# Patient Record
Sex: Male | Born: 1945 | Race: White | Hispanic: No | Marital: Single | Smoking: Former smoker
Health system: Southern US, Community
[De-identification: ages and names within clinical notes are randomized; demographics above are authoritative.]

## PROBLEM LIST (undated history)

## (undated) DIAGNOSIS — K219 Gastro-esophageal reflux disease without esophagitis: Secondary | ICD-10-CM

## (undated) DIAGNOSIS — I350 Nonrheumatic aortic (valve) stenosis: Secondary | ICD-10-CM

## (undated) DIAGNOSIS — N400 Enlarged prostate without lower urinary tract symptoms: Secondary | ICD-10-CM

## (undated) DIAGNOSIS — I10 Essential (primary) hypertension: Secondary | ICD-10-CM

## (undated) DIAGNOSIS — Z87442 Personal history of urinary calculi: Secondary | ICD-10-CM

## (undated) DIAGNOSIS — I480 Paroxysmal atrial fibrillation: Secondary | ICD-10-CM

## (undated) DIAGNOSIS — I2510 Atherosclerotic heart disease of native coronary artery without angina pectoris: Secondary | ICD-10-CM

## (undated) DIAGNOSIS — E119 Type 2 diabetes mellitus without complications: Secondary | ICD-10-CM

## (undated) DIAGNOSIS — E7800 Pure hypercholesterolemia, unspecified: Secondary | ICD-10-CM

## (undated) DIAGNOSIS — R011 Cardiac murmur, unspecified: Secondary | ICD-10-CM

## (undated) DIAGNOSIS — I499 Cardiac arrhythmia, unspecified: Secondary | ICD-10-CM

## (undated) DIAGNOSIS — F419 Anxiety disorder, unspecified: Secondary | ICD-10-CM

## (undated) HISTORY — PX: BACK SURGERY: SHX140

## (undated) HISTORY — PX: TONSILLECTOMY: SUR1361

## (undated) HISTORY — PX: CERVICAL SPINE SURGERY: SHX589

## (undated) SURGERY — EGD (ESOPHAGOGASTRODUODENOSCOPY)
Anesthesia: Moderate Sedation

## (undated) SURGERY — COLONOSCOPY
Anesthesia: Moderate Sedation

---

## 2010-09-27 DIAGNOSIS — G8929 Other chronic pain: Secondary | ICD-10-CM | POA: Insufficient documentation

## 2010-09-27 DIAGNOSIS — Z87438 Personal history of other diseases of male genital organs: Secondary | ICD-10-CM | POA: Insufficient documentation

## 2011-11-21 DIAGNOSIS — N200 Calculus of kidney: Secondary | ICD-10-CM | POA: Insufficient documentation

## 2012-03-10 DIAGNOSIS — R918 Other nonspecific abnormal finding of lung field: Secondary | ICD-10-CM | POA: Insufficient documentation

## 2012-05-08 DIAGNOSIS — Z794 Long term (current) use of insulin: Secondary | ICD-10-CM | POA: Insufficient documentation

## 2012-09-08 DIAGNOSIS — M1712 Unilateral primary osteoarthritis, left knee: Secondary | ICD-10-CM | POA: Insufficient documentation

## 2014-09-23 DIAGNOSIS — Z79891 Long term (current) use of opiate analgesic: Secondary | ICD-10-CM | POA: Insufficient documentation

## 2016-04-03 DIAGNOSIS — K5732 Diverticulitis of large intestine without perforation or abscess without bleeding: Secondary | ICD-10-CM | POA: Insufficient documentation

## 2018-07-15 ENCOUNTER — Ambulatory Visit (INDEPENDENT_AMBULATORY_CARE_PROVIDER_SITE_OTHER): Payer: Medicare PPO | Admitting: Urology

## 2018-07-15 DIAGNOSIS — R351 Nocturia: Secondary | ICD-10-CM

## 2018-07-23 ENCOUNTER — Other Ambulatory Visit (HOSPITAL_BASED_OUTPATIENT_CLINIC_OR_DEPARTMENT_OTHER): Payer: Self-pay

## 2018-07-23 DIAGNOSIS — G4733 Obstructive sleep apnea (adult) (pediatric): Secondary | ICD-10-CM

## 2018-08-05 ENCOUNTER — Other Ambulatory Visit (HOSPITAL_BASED_OUTPATIENT_CLINIC_OR_DEPARTMENT_OTHER): Payer: Self-pay

## 2018-08-10 ENCOUNTER — Other Ambulatory Visit (HOSPITAL_COMMUNITY)
Admission: RE | Admit: 2018-08-10 | Discharge: 2018-08-10 | Disposition: A | Discharge status: Home / Self Care | Payer: Medicare PPO | Source: Ambulatory Visit | Attending: Neurology | Admitting: Neurology

## 2018-08-10 DIAGNOSIS — Z1159 Encounter for screening for other viral diseases: Secondary | ICD-10-CM | POA: Insufficient documentation

## 2018-08-10 DIAGNOSIS — Z01812 Encounter for preprocedural laboratory examination: Secondary | ICD-10-CM | POA: Insufficient documentation

## 2018-08-11 LAB — NOVEL CORONAVIRUS, NAA (HOSP ORDER, SEND-OUT TO REF LAB; TAT 18-24 HRS): SARS-CoV-2, NAA: NOT DETECTED

## 2018-08-13 ENCOUNTER — Other Ambulatory Visit: Payer: Self-pay

## 2018-08-13 ENCOUNTER — Ambulatory Visit: Payer: Medicare PPO | Attending: Neurology | Admitting: Neurology

## 2018-08-13 DIAGNOSIS — G4761 Periodic limb movement disorder: Secondary | ICD-10-CM | POA: Diagnosis not present

## 2018-08-13 DIAGNOSIS — G4733 Obstructive sleep apnea (adult) (pediatric): Secondary | ICD-10-CM

## 2018-08-20 NOTE — Procedures (Signed)
  [place] A. Merlene Laughter, MD     [email or web address]             NOCTURNAL POLYSOMNOGRAPHY   LOCATION: [facility]   Patient Name: Alec Larson, Alec Larson Date: 08/13/2018 Gender: Male D.O.B: 06-Oct-1945 Age (years): 72 Referring Provider: Phillips Odor MD, ABSM Height (inches): 65 Interpreting Physician: Phillips Odor MD, ABSM Weight (lbs): 215 RPSGT: Peak, Robert BMI: 36 MRN: [id number] Neck Size: 17.50 CLINICAL INFORMATION Sleep Study Type: NPSG     Indication for sleep study: N/A     Epworth Sleepiness Score: 5     SLEEP STUDY TECHNIQUE As per the AASM Manual for the Scoring of Sleep and Associated Events v2.3 (April 2016) with a hypopnea requiring 4% desaturations.  The channels recorded and monitored were frontal, central and occipital EEG, electrooculogram (EOG), submentalis EMG (chin), nasal and oral airflow, thoracic and abdominal wall motion, anterior tibialis EMG, snore microphone, electrocardiogram, and pulse oximetry.  MEDICATIONS Medications self-administered by patient taken the night of the study : N/A No current outpatient medications on file.      SLEEP ARCHITECTURE The study was initiated at 9:03:50 PM and ended at 4:31:25 AM.  Sleep onset time was 68.0 minutes and the sleep efficiency was 54.0%%. The total sleep time was 241.6 minutes.  Stage REM latency was N/A minutes.  The patient spent 22.1%% of the night in stage N1 sleep, 77.9%% in stage N2 sleep, 0.0%% in stage N3 and 0% in REM.  Alpha intrusion was absent.  Supine sleep was 27.53%.  RESPIRATORY PARAMETERS The overall apnea/hypopnea index (AHI) was 14.4 per hour. There were 0 total apneas, including 0 obstructive, 0 central and 0 mixed apneas. There were 58 hypopneas and 22 RERAs.  The AHI during Stage REM sleep was N/A per hour.  AHI while supine was 29.8 per hour.  The mean oxygen saturation was 87.8%. The minimum SpO2 during sleep was 84.0%.  soft snoring  was noted during this study.  CARDIAC DATA The 2 lead EKG demonstrated sinus rhythm. The mean heart rate was 63.4 beats per minute. Other EKG findings include: None.  LEG MOVEMENT DATA The PLM  Index is estimated to be moderate. Associated arousal with leg movement index was 3.2.  IMPRESSIONS 1. Moderate obstructive sleep apnea is documented with this study. AutoPAP 8-14 is recommended. 2. 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 3. Abnormal sleep architecture is noted with absent slow wave sleep, absent REM sleep and reduced sleep efficiency.   Delano Metz, MD Diplomate, American Board of Sleep Medicine.  ELECTRONICALLY SIGNED ON:  08/20/2018, 5:53 PM [place] PH: [phone]   FX: [phone] [place]

## 2018-09-02 ENCOUNTER — Ambulatory Visit (INDEPENDENT_AMBULATORY_CARE_PROVIDER_SITE_OTHER): Payer: Medicare PPO | Admitting: Urology

## 2018-09-02 DIAGNOSIS — N401 Enlarged prostate with lower urinary tract symptoms: Secondary | ICD-10-CM | POA: Diagnosis not present

## 2018-09-02 DIAGNOSIS — R351 Nocturia: Secondary | ICD-10-CM

## 2018-10-14 ENCOUNTER — Ambulatory Visit (INDEPENDENT_AMBULATORY_CARE_PROVIDER_SITE_OTHER): Payer: Medicare PPO | Admitting: Urology

## 2018-10-14 DIAGNOSIS — N401 Enlarged prostate with lower urinary tract symptoms: Secondary | ICD-10-CM | POA: Diagnosis not present

## 2018-10-14 DIAGNOSIS — R351 Nocturia: Secondary | ICD-10-CM

## 2018-11-04 ENCOUNTER — Ambulatory Visit (INDEPENDENT_AMBULATORY_CARE_PROVIDER_SITE_OTHER): Payer: Medicare PPO | Admitting: Urology

## 2018-11-04 DIAGNOSIS — R3914 Feeling of incomplete bladder emptying: Secondary | ICD-10-CM

## 2018-12-30 ENCOUNTER — Ambulatory Visit (INDEPENDENT_AMBULATORY_CARE_PROVIDER_SITE_OTHER): Payer: Medicare PPO | Admitting: Urology

## 2018-12-30 DIAGNOSIS — N401 Enlarged prostate with lower urinary tract symptoms: Secondary | ICD-10-CM | POA: Diagnosis not present

## 2018-12-30 DIAGNOSIS — R351 Nocturia: Secondary | ICD-10-CM

## 2019-01-04 ENCOUNTER — Other Ambulatory Visit: Payer: Self-pay | Admitting: Urology

## 2019-01-05 ENCOUNTER — Encounter (INDEPENDENT_AMBULATORY_CARE_PROVIDER_SITE_OTHER): Payer: Self-pay | Admitting: Gastroenterology

## 2019-01-13 ENCOUNTER — Encounter (HOSPITAL_COMMUNITY): Payer: Self-pay

## 2019-01-13 NOTE — Patient Instructions (Signed)
Alec Larson  01/13/2019     @PREFPERIOPPHARMACY @   Your procedure is scheduled on  01/20/2019 .  Report to [facility] at  1115  A.M.  Call this number if you have problems the morning of surgery:  [phone]   Remember:  Do not eat or drink after midnight.                         Take these medicines the morning of surgery with A SIP OF WATER  Amlodipine, cymbalta, gabapentin, metoprolol, prilosec, oxycodone(if needed). Only take 1/2 of your usual night time insulin dosage the night before your surgery. DO NOT take any medications for diabetes the morning of your surgery.    Do not wear jewelry, make-up or nail polish.  Do not wear lotions, powders, or perfumes. Please wear deodorant and brush your teeth.  Do not shave 48 hours prior to surgery.  Men may shave face and neck.  Do not bring valuables to the hospital.  [facility] is not responsible for any belongings or valuables.  Contacts, dentures or bridgework may not be worn into surgery.  Leave your suitcase in the car.  After surgery it may be brought to your room.  For patients admitted to the hospital, discharge time will be determined by your treatment team.  Patients discharged the day of surgery will not be allowed to drive home.   Name and phone number of your driver:   family Special instructions:  None  Please read over the following fact sheets that you were given. Anesthesia Post-op Instructions and Care and Recovery After Surgery       Cystoscopy Cystoscopy is a procedure that is used to help diagnose and sometimes treat conditions that affect the lower urinary tract. The lower urinary tract includes the bladder and the urethra. The urethra is the tube that drains urine from the bladder. Cystoscopy is done using a thin, tube-shaped instrument with a light and camera at the end (cystoscope). The cystoscope may be hard or flexible, depending on the goal of the procedure. The cystoscope is inserted  through the urethra, into the bladder. Cystoscopy may be recommended if you have:  Urinary tract infections that keep coming back.  Blood in the urine (hematuria).  An inability to control when you urinate (urinary incontinence) or an overactive bladder.  Unusual cells found in a urine sample.  A blockage in the urethra, such as a urinary stone.  Painful urination.  An abnormality in the bladder found during an intravenous pyelogram (IVP) or CT scan. Cystoscopy may also be done to remove a sample of tissue to be examined under a microscope (biopsy). Tell a health care provider about:  Any allergies you have.  All medicines you are taking, including vitamins, herbs, eye drops, creams, and over-the-counter medicines.  Any problems you or family members have had with anesthetic medicines.  Any blood disorders you have.  Any surgeries you have had.  Any medical conditions you have.  Whether you are pregnant or may be pregnant. What are the risks? Generally, this is a safe procedure. However, problems may occur, including:  Infection.  Bleeding.  Allergic reactions to medicines.  Damage to other structures or organs. What happens before the procedure?  Ask your health care provider about: ? Changing or stopping your regular medicines. This is especially important if you are taking diabetes medicines or blood thinners. ? Taking medicines  such as aspirin and ibuprofen. These medicines can thin your blood. Do not take these medicines unless your health care provider tells you to take them. ? Taking over-the-counter medicines, vitamins, herbs, and supplements.  Follow instructions from your health care provider about eating or drinking restrictions.  Ask your health care provider what steps will be taken to help prevent infection. These may include: ? Washing skin with a germ-killing soap. ? Taking antibiotic medicine.  You may have an exam or testing, such as: ? X-rays  of the bladder, urethra, or kidneys. ? Urine tests to check for signs of infection.  Plan to have someone take you home from the hospital or clinic. What happens during the procedure?   You will be given one or more of the following: ? A medicine to help you relax (sedative). ? A medicine to numb the area (local anesthetic).  The area around the opening of your urethra will be cleaned.  The cystoscope will be passed through your urethra into your bladder.  Germ-free (sterile) fluid will flow through the cystoscope to fill your bladder. The fluid will stretch your bladder so that your health care provider can clearly examine your bladder walls.  Your doctor will look at the urethra and bladder. Your doctor may take a biopsy or remove stones.  The cystoscope will be removed, and your bladder will be emptied. The procedure may vary among health care providers and hospitals. What can I expect after the procedure? After the procedure, it is common to have:  Some soreness or pain in your abdomen and urethra.  Urinary symptoms. These include: ? Mild pain or burning when you urinate. Pain should stop within a few minutes after you urinate. This may last for up to 1 week. ? A small amount of blood in your urine for several days. ? Feeling like you need to urinate but producing only a small amount of urine. Follow these instructions at home: Medicines  Take over-the-counter and prescription medicines only as told by your health care provider.  If you were prescribed an antibiotic medicine, take it as told by your health care provider. Do not stop taking the antibiotic even if you start to feel better. General instructions  Return to your normal activities as told by your health care provider. Ask your health care provider what activities are safe for you.  Do not drive for 24 hours if you were given a sedative during your procedure.  Watch for any blood in your urine. If the amount of  blood in your urine increases, call your health care provider.  Follow instructions from your health care provider about eating or drinking restrictions.  If a tissue sample was removed for testing (biopsy) during your procedure, it is up to you to get your test results. Ask your health care provider, or the department that is doing the test, when your results will be ready.  Drink enough fluid to keep your urine pale yellow.  Keep all follow-up visits as told by your health care provider. This is important. Contact a health care provider if you:  Have pain that gets worse or does not get better with medicine, especially pain when you urinate.  Have trouble urinating.  Have more blood in your urine. Get help right away if you:  Have blood clots in your urine.  Have abdominal pain.  Have a fever or chills.  Are unable to urinate. Summary  Cystoscopy is a procedure that is used to help  diagnose and sometimes treat conditions that affect the lower urinary tract.  Cystoscopy is done using a thin, tube-shaped instrument with a light and camera at the end.  After the procedure, it is common to have some soreness or pain in your abdomen and urethra.  Watch for any blood in your urine. If the amount of blood in your urine increases, call your health care provider.  If you were prescribed an antibiotic medicine, take it as told by your health care provider. Do not stop taking the antibiotic even if you start to feel better. This information is not intended to replace advice given to you by your health care provider. Make sure you discuss any questions you have with your health care provider. Document Released: 01/26/2000 Document Revised: 01/20/2018 Document Reviewed: 01/20/2018 Elsevier Patient Education  2020 [employer] These instructions provide you with information about caring for yourself after your procedure. Your health care provider  may also give you more specific instructions. Your treatment has been planned according to current medical practices, but problems sometimes occur. Call your health care provider if you have any problems or questions after your procedure. What can I expect after the procedure? After your procedure, you may:  Feel sleepy for several hours.  Feel clumsy and have poor balance for several hours.  Feel forgetful about what happened after the procedure.  Have poor judgment for several hours.  Feel nauseous or vomit.  Have a sore throat if you had a breathing tube during the procedure. Follow these instructions at home: For at least 24 hours after the procedure:      Have a responsible adult stay with you. It is important to have someone help care for you until you are awake and alert.  Rest as needed.  Do not: ? Participate in activities in which you could fall or become injured. ? Drive. ? Use heavy machinery. ? Drink alcohol. ? Take sleeping pills or medicines that cause drowsiness. ? Make important decisions or sign legal documents. ? Take care of children on your own. Eating and drinking  Follow the diet that is recommended by your health care provider.  If you vomit, drink water, juice, or soup when you can drink without vomiting.  Make sure you have little or no nausea before eating solid foods. General instructions  Take over-the-counter and prescription medicines only as told by your health care provider.  If you have sleep apnea, surgery and certain medicines can increase your risk for breathing problems. Follow instructions from your health care provider about wearing your sleep device: ? Anytime you are sleeping, including during daytime naps. ? While taking prescription pain medicines, sleeping medicines, or medicines that make you drowsy.  If you smoke, do not smoke without supervision.  Keep all follow-up visits as told by your health care provider. This is  important. Contact a health care provider if:  You keep feeling nauseous or you keep vomiting.  You feel light-headed.  You develop a rash.  You have a fever. Get help right away if:  You have trouble breathing. Summary  For several hours after your procedure, you may feel sleepy and have poor judgment.  Have a responsible adult stay with you for at least 24 hours or until you are awake and alert. This information is not intended to replace advice given to you by your health care provider. Make sure you discuss any questions you have with your health care provider.  Document Released: 05/21/2015 Document Revised: 04/28/2017 Document Reviewed: 05/21/2015 Elsevier Patient Education  2020 [employer]. How to Use Chlorhexidine for Bathing Chlorhexidine gluconate (CHG) is a germ-killing (antiseptic) solution that is used to clean the skin. It can get rid of the bacteria that normally live on the skin and can keep them away for about 24 hours. To clean your skin with CHG, you may be given:  A CHG solution to use in the shower or as part of a sponge bath.  A prepackaged cloth that contains CHG. Cleaning your skin with CHG may help lower the risk for infection:  While you are staying in the intensive care unit of the hospital.  If you have a vascular access, such as a central line, to provide short-term or long-term access to your veins.  If you have a catheter to drain urine from your bladder.  If you are on a ventilator. A ventilator is a machine that helps you breathe by moving air in and out of your lungs.  After surgery. What are the risks? Risks of using CHG include:  A skin reaction.  Hearing loss, if CHG gets in your ears.  Eye injury, if CHG gets in your eyes and is not rinsed out.  The CHG product catching fire. Make sure that you avoid smoking and flames after applying CHG to your skin. Do not use CHG:  If you have a chlorhexidine allergy or have previously  reacted to chlorhexidine.  On babies younger than 51 months of age. How to use CHG solution  Use CHG only as told by your health care provider, and follow the instructions on the label.  Use the full amount of CHG as directed. Usually, this is one bottle. During a shower Follow these steps when using CHG solution during a shower (unless your health care provider gives you different instructions): 1. Start the shower. 2. Use your normal soap and shampoo to wash your face and hair. 3. Turn off the shower or move out of the shower stream. 4. Pour the CHG onto a clean washcloth. Do not use any type of brush or rough-edged sponge. 5. Starting at your neck, lather your body down to your toes. Make sure you follow these instructions: ? If you will be having surgery, pay special attention to the part of your body where you will be having surgery. Scrub this area for at least 1 minute. ? Do not use CHG on your head or face. If the solution gets into your ears or eyes, rinse them well with water. ? Avoid your genital area. ? Avoid any areas of skin that have broken skin, cuts, or scrapes. ? Scrub your back and under your arms. Make sure to wash skin folds. 6. Let the lather sit on your skin for 1-2 minutes or as long as told by your health care provider. 7. Thoroughly rinse your entire body in the shower. Make sure that all body creases and crevices are rinsed well. 8. Dry off with a clean towel. Do not put any substances on your body afterward-such as powder, lotion, or perfume-unless you are told to do so by your health care provider. Only use lotions that are recommended by the manufacturer. 9. Put on clean clothes or pajamas. 10. If it is the night before your surgery, sleep in clean sheets.  During a sponge bath Follow these steps when using CHG solution during a sponge bath (unless your health care provider gives you different instructions): 1. Use  your normal soap and shampoo to wash your  face and hair. 2. Pour the CHG onto a clean washcloth. 3. Starting at your neck, lather your body down to your toes. Make sure you follow these instructions: ? If you will be having surgery, pay special attention to the part of your body where you will be having surgery. Scrub this area for at least 1 minute. ? Do not use CHG on your head or face. If the solution gets into your ears or eyes, rinse them well with water. ? Avoid your genital area. ? Avoid any areas of skin that have broken skin, cuts, or scrapes. ? Scrub your back and under your arms. Make sure to wash skin folds. 4. Let the lather sit on your skin for 1-2 minutes or as long as told by your health care provider. 5. Using a different clean, wet washcloth, thoroughly rinse your entire body. Make sure that all body creases and crevices are rinsed well. 6. Dry off with a clean towel. Do not put any substances on your body afterward-such as powder, lotion, or perfume-unless you are told to do so by your health care provider. Only use lotions that are recommended by the manufacturer. 7. Put on clean clothes or pajamas. 8. If it is the night before your surgery, sleep in clean sheets. How to use CHG prepackaged cloths  Only use CHG cloths as told by your health care provider, and follow the instructions on the label.  Use the CHG cloth on clean, dry skin.  Do not use the CHG cloth on your head or face unless your health care provider tells you to.  When washing with the CHG cloth: ? Avoid your genital area. ? Avoid any areas of skin that have broken skin, cuts, or scrapes. Before surgery Follow these steps when using a CHG cloth to clean before surgery (unless your health care provider gives you different instructions): 1. Using the CHG cloth, vigorously scrub the part of your body where you will be having surgery. Scrub using a back-and-forth motion for 3 minutes. The area on your body should be completely wet with CHG when you are  done scrubbing. 2. Do not rinse. Discard the cloth and let the area air-dry. Do not put any substances on the area afterward, such as powder, lotion, or perfume. 3. Put on clean clothes or pajamas. 4. If it is the night before your surgery, sleep in clean sheets.  For general bathing Follow these steps when using CHG cloths for general bathing (unless your health care provider gives you different instructions). 1. Use a separate CHG cloth for each area of your body. Make sure you wash between any folds of skin and between your fingers and toes. Wash your body in the following order, switching to a new cloth after each step: ? The front of your neck, shoulders, and chest. ? Both of your arms, under your arms, and your hands. ? Your stomach and groin area, avoiding the genitals. ? Your right leg and foot. ? Your left leg and foot. ? The back of your neck, your back, and your buttocks. 2. Do not rinse. Discard the cloth and let the area air-dry. Do not put any substances on your body afterward-such as powder, lotion, or perfume-unless you are told to do so by your health care provider. Only use lotions that are recommended by the manufacturer. 3. Put on clean clothes or pajamas. Contact a health care provider if:  Your skin  gets irritated after scrubbing.  You have questions about using your solution or cloth. Get help right away if:  Your eyes become very red or swollen.  Your eyes itch badly.  Your skin itches badly and is red or swollen.  Your hearing changes.  You have trouble seeing.  You have swelling or tingling in your mouth or throat.  You have trouble breathing.  You swallow any chlorhexidine. Summary  Chlorhexidine gluconate (CHG) is a germ-killing (antiseptic) solution that is used to clean the skin. Cleaning your skin with CHG may help to lower your risk for infection.  You may be given CHG to use for bathing. It may be in a bottle or in a prepackaged cloth to use  on your skin. Carefully follow your health care provider's instructions and the instructions on the product label.  Do not use CHG if you have a chlorhexidine allergy.  Contact your health care provider if your skin gets irritated after scrubbing. This information is not intended to replace advice given to you by your health care provider. Make sure you discuss any questions you have with your health care provider. Document Released: 10/23/2011 Document Revised: 04/16/2018 Document Reviewed: 12/26/2016 Elsevier Patient Education  2020 [employer].

## 2019-01-15 ENCOUNTER — Encounter (HOSPITAL_COMMUNITY): Payer: Self-pay

## 2019-01-15 ENCOUNTER — Other Ambulatory Visit: Payer: Self-pay

## 2019-01-15 ENCOUNTER — Encounter (HOSPITAL_COMMUNITY)
Admission: RE | Admit: 2019-01-15 | Discharge: 2019-01-15 | Disposition: A | Discharge status: Home / Self Care | Payer: Medicare PPO | Source: Ambulatory Visit | Attending: Urology | Admitting: Urology

## 2019-01-15 DIAGNOSIS — Z01818 Encounter for other preprocedural examination: Secondary | ICD-10-CM | POA: Diagnosis not present

## 2019-01-15 HISTORY — DX: Pure hypercholesterolemia, unspecified: E78.00

## 2019-01-15 HISTORY — DX: Gastro-esophageal reflux disease without esophagitis: K21.9

## 2019-01-15 HISTORY — DX: Benign prostatic hyperplasia without lower urinary tract symptoms: N40.0

## 2019-01-15 HISTORY — DX: Personal history of urinary calculi: Z87.442

## 2019-01-15 HISTORY — DX: Essential (primary) hypertension: I10

## 2019-01-15 HISTORY — DX: Type 2 diabetes mellitus without complications: E11.9

## 2019-01-15 HISTORY — DX: Anxiety disorder, unspecified: F41.9

## 2019-01-15 LAB — BASIC METABOLIC PANEL
Anion gap: 13 (ref 5–15)
BUN: 16 mg/dL (ref 8–23)
CO2: 24 mmol/L (ref 22–32)
Calcium: 9 mg/dL (ref 8.9–10.3)
Chloride: 99 mmol/L (ref 98–111)
Creatinine, Ser: 1.25 mg/dL — ABNORMAL HIGH (ref 0.61–1.24)
GFR calc Af Amer: >60 mL/min (ref >60)
GFR calc non Af Amer: 56 mL/min — ABNORMAL LOW (ref >60)
Glucose, Bld: 217 mg/dL — ABNORMAL HIGH (ref 70–99)
Potassium: 3.6 mmol/L (ref 3.5–5.1)
Sodium: 136 mmol/L (ref 135–145)

## 2019-01-15 LAB — CBC WITH DIFFERENTIAL/PLATELET
Abs Immature Granulocytes: 0.16 10*3/uL — ABNORMAL HIGH (ref 0.00–0.07)
Basophils Absolute: 0.1 10*3/uL (ref 0.0–0.1)
Basophils Relative: 1 %
Eosinophils Absolute: 0.5 10*3/uL (ref 0.0–0.5)
Eosinophils Relative: 4 %
HCT: 42.5 % (ref 39.0–52.0)
Hemoglobin: 13.7 g/dL (ref 13.0–17.0)
Immature Granulocytes: 1 %
Lymphocytes Relative: 22 %
Lymphs Abs: 2.4 10*3/uL (ref 0.7–4.0)
MCH: 28.8 pg (ref 26.0–34.0)
MCHC: 32.2 g/dL (ref 30.0–36.0)
MCV: 89.3 fL (ref 80.0–100.0)
Monocytes Absolute: 1.1 10*3/uL — ABNORMAL HIGH (ref 0.1–1.0)
Monocytes Relative: 10 %
Neutro Abs: 6.8 10*3/uL (ref 1.7–7.7)
Neutrophils Relative %: 62 %
Platelets: 295 10*3/uL (ref 150–400)
RBC: 4.76 MIL/uL (ref 4.22–5.81)
RDW: 14.5 % (ref 11.5–15.5)
WBC: 11 10*3/uL — ABNORMAL HIGH (ref 4.0–10.5)
nRBC: 0 % (ref 0.0–0.2)

## 2019-01-15 LAB — HEMOGLOBIN A1C
Hgb A1c MFr Bld: 8.8 % — ABNORMAL HIGH (ref 4.8–5.6)
Mean Plasma Glucose: 205.86 mg/dL

## 2019-01-15 LAB — GLUCOSE, CAPILLARY: Glucose-Capillary: 188 mg/dL — ABNORMAL HIGH (ref 70–99)

## 2019-01-18 NOTE — Pre-Procedure Instructions (Signed)
HgbA1C routed to surgeon.

## 2019-01-19 ENCOUNTER — Other Ambulatory Visit: Payer: Self-pay

## 2019-01-19 ENCOUNTER — Other Ambulatory Visit (HOSPITAL_COMMUNITY)
Admission: RE | Admit: 2019-01-19 | Discharge: 2019-01-19 | Disposition: A | Discharge status: Home / Self Care | Payer: Medicare PPO | Source: Ambulatory Visit | Attending: Urology | Admitting: Urology

## 2019-01-19 DIAGNOSIS — Z20828 Contact with and (suspected) exposure to other viral communicable diseases: Secondary | ICD-10-CM | POA: Insufficient documentation

## 2019-01-19 DIAGNOSIS — Z01812 Encounter for preprocedural laboratory examination: Secondary | ICD-10-CM | POA: Insufficient documentation

## 2019-01-19 LAB — SARS CORONAVIRUS 2 (TAT 6-24 HRS): SARS Coronavirus 2: NEGATIVE

## 2019-01-20 ENCOUNTER — Encounter (HOSPITAL_COMMUNITY)
Admission: RE | Disposition: A | Discharge status: Home / Self Care | Payer: Self-pay | Source: Home / Self Care | Attending: Urology

## 2019-01-20 ENCOUNTER — Ambulatory Visit (HOSPITAL_COMMUNITY): Payer: Medicare PPO | Admitting: Anesthesiology

## 2019-01-20 ENCOUNTER — Ambulatory Visit (HOSPITAL_COMMUNITY)
Admission: RE | Admit: 2019-01-20 | Discharge: 2019-01-20 | Disposition: A | Discharge status: Home / Self Care | Payer: Medicare PPO | Attending: Urology | Admitting: Urology

## 2019-01-20 ENCOUNTER — Encounter (HOSPITAL_COMMUNITY): Payer: Self-pay | Admitting: *Deleted

## 2019-01-20 DIAGNOSIS — N138 Other obstructive and reflux uropathy: Secondary | ICD-10-CM | POA: Diagnosis not present

## 2019-01-20 DIAGNOSIS — R350 Frequency of micturition: Secondary | ICD-10-CM | POA: Diagnosis not present

## 2019-01-20 DIAGNOSIS — Z87891 Personal history of nicotine dependence: Secondary | ICD-10-CM | POA: Insufficient documentation

## 2019-01-20 DIAGNOSIS — R3915 Urgency of urination: Secondary | ICD-10-CM | POA: Insufficient documentation

## 2019-01-20 DIAGNOSIS — N320 Bladder-neck obstruction: Secondary | ICD-10-CM | POA: Diagnosis not present

## 2019-01-20 DIAGNOSIS — I10 Essential (primary) hypertension: Secondary | ICD-10-CM | POA: Insufficient documentation

## 2019-01-20 DIAGNOSIS — E119 Type 2 diabetes mellitus without complications: Secondary | ICD-10-CM | POA: Diagnosis not present

## 2019-01-20 DIAGNOSIS — Z880 Allergy status to penicillin: Secondary | ICD-10-CM | POA: Insufficient documentation

## 2019-01-20 DIAGNOSIS — Z886 Allergy status to analgesic agent status: Secondary | ICD-10-CM | POA: Insufficient documentation

## 2019-01-20 DIAGNOSIS — N401 Enlarged prostate with lower urinary tract symptoms: Secondary | ICD-10-CM | POA: Insufficient documentation

## 2019-01-20 DIAGNOSIS — Z87442 Personal history of urinary calculi: Secondary | ICD-10-CM | POA: Diagnosis not present

## 2019-01-20 HISTORY — PX: CYSTOSCOPY WITH INSERTION OF UROLIFT: SHX6678

## 2019-01-20 LAB — GLUCOSE, CAPILLARY: Glucose-Capillary: 201 mg/dL — ABNORMAL HIGH (ref 70–99)

## 2019-01-20 SURGERY — CYSTOSCOPY WITH INSERTION OF UROLIFT
Anesthesia: General

## 2019-01-20 MED ORDER — LIDOCAINE 2% (20 MG/ML) 5 ML SYRINGE
INTRAMUSCULAR | Status: DC | PRN
  Administered 2019-01-20: 40 mg via INTRAVENOUS

## 2019-01-20 MED ORDER — WATER FOR IRRIGATION, STERILE IR SOLN
Status: DC | PRN
  Administered 2019-01-20: 3000 mL

## 2019-01-20 MED ORDER — FENTANYL CITRATE (PF) 100 MCG/2ML IJ SOLN
INTRAMUSCULAR | Status: DC | PRN
  Administered 2019-01-20 (×2): 50 ug via INTRAVENOUS

## 2019-01-20 MED ORDER — PROPOFOL 10 MG/ML IV BOLUS
INTRAVENOUS | Status: AC
  Filled 2019-01-20: qty 20

## 2019-01-20 MED ORDER — GLYCOPYRROLATE PF 0.2 MG/ML IJ SOSY
PREFILLED_SYRINGE | INTRAMUSCULAR | Status: DC | PRN
  Administered 2019-01-20: .2 mg via INTRAVENOUS

## 2019-01-20 MED ORDER — HYDROMORPHONE HCL 1 MG/ML IJ SOLN: 0.2500 mg | INTRAMUSCULAR | Status: DC | PRN

## 2019-01-20 MED ORDER — MEPERIDINE HCL 50 MG/ML IJ SOLN: 6.2500 mg | INTRAMUSCULAR | Status: DC | PRN

## 2019-01-20 MED ORDER — LIDOCAINE VISCOUS HCL 2 % MT SOLN
OROMUCOSAL | Status: AC
  Filled 2019-01-20: qty 15

## 2019-01-20 MED ORDER — FENTANYL CITRATE (PF) 100 MCG/2ML IJ SOLN
INTRAMUSCULAR | Status: AC
  Filled 2019-01-20: qty 2

## 2019-01-20 MED ORDER — CEFAZOLIN SODIUM-DEXTROSE 2-4 GM/100ML-% IV SOLN
2.0000 g | INTRAVENOUS | Status: AC
  Administered 2019-01-20: 2 g via INTRAVENOUS

## 2019-01-20 MED ORDER — LIDOCAINE HCL URETHRAL/MUCOSAL 2 % EX GEL
CUTANEOUS | Status: AC
  Filled 2019-01-20: qty 10

## 2019-01-20 MED ORDER — OXYCODONE-ACETAMINOPHEN 10-325 MG PO TABS: 1.0000 | ORAL_TABLET | Freq: Four times a day (QID) | ORAL | 0 refills | Status: DC | PRN

## 2019-01-20 MED ORDER — ONDANSETRON HCL 4 MG/2ML IJ SOLN: 4.0000 mg | Freq: Once | INTRAMUSCULAR | Status: DC | PRN

## 2019-01-20 MED ORDER — CEFAZOLIN SODIUM-DEXTROSE 2-4 GM/100ML-% IV SOLN
INTRAVENOUS | Status: AC
  Filled 2019-01-20: qty 100

## 2019-01-20 MED ORDER — PROPOFOL 10 MG/ML IV BOLUS
INTRAVENOUS | Status: DC | PRN
  Administered 2019-01-20: 240 mg via INTRAVENOUS

## 2019-01-20 MED ORDER — LACTATED RINGERS IV SOLN
INTRAVENOUS | Status: DC | PRN
  Administered 2019-01-20: 12:00:00 via INTRAVENOUS

## 2019-01-20 MED ORDER — LACTATED RINGERS IV SOLN
Freq: Once | INTRAVENOUS | Status: AC
  Administered 2019-01-20: 11:00:00 via INTRAVENOUS

## 2019-01-20 SURGICAL SUPPLY — 20 items
BAG DRAIN CYSTO-URO STER (DRAIN) ×3 IMPLANT
BAG DRAIN URO TABLE W/ADPT NS (BAG) ×3 IMPLANT
CATH FOLEY 2WAY SLVR  5CC 16FR (CATHETERS) ×2
CATH FOLEY 2WAY SLVR 5CC 16FR (CATHETERS) ×1 IMPLANT
CLOTH BEACON ORANGE TIMEOUT ST (SAFETY) ×3 IMPLANT
GLOVE BIO SURGEON STRL SZ8 (GLOVE) ×3 IMPLANT
GLOVE BIOGEL PI IND STRL 7.0 (GLOVE) ×2 IMPLANT
GLOVE BIOGEL PI INDICATOR 7.0 (GLOVE) ×4
GOWN STRL REUS W/TWL LRG LVL3 (GOWN DISPOSABLE) ×3 IMPLANT
GOWN STRL REUS W/TWL XL LVL3 (GOWN DISPOSABLE) ×3 IMPLANT
KIT TURNOVER CYSTO (KITS) ×3 IMPLANT
MANIFOLD NEPTUNE II (INSTRUMENTS) ×3 IMPLANT
PACK CYSTO (CUSTOM PROCEDURE TRAY) ×3 IMPLANT
PAD ARMBOARD 7.5X6 YLW CONV (MISCELLANEOUS) ×3 IMPLANT
SYSTEM UROLIFT (Male Continence) ×18 IMPLANT
TOWEL OR 17X26 4PK STRL BLUE (TOWEL DISPOSABLE) ×3 IMPLANT
TRAY FOLEY W/BAG SLVR 16FR (SET/KITS/TRAYS/PACK) ×2
TRAY FOLEY W/BAG SLVR 16FR ST (SET/KITS/TRAYS/PACK) ×1 IMPLANT
WATER STERILE IRR 3000ML UROMA (IV SOLUTION) ×3 IMPLANT
WATER STERILE IRR 500ML POUR (IV SOLUTION) ×3 IMPLANT

## 2019-01-20 NOTE — Anesthesia Procedure Notes (Signed)
Procedure Name: LMA Insertion Date/Time: 01/20/2019 12:38 PM Performed by: Floreine Kingdon A, CRNA Pre-anesthesia Checklist: Patient identified, Emergency Drugs available, Suction available, Patient being monitored and Timeout performed Patient Re-evaluated:Patient Re-evaluated prior to induction Oxygen Delivery Method: Circle system utilized Preoxygenation: Pre-oxygenation with 100% oxygen Induction Type: IV induction LMA: LMA inserted LMA Size: 4.0 Number of attempts: 1 Placement Confirmation: positive ETCO2 and breath sounds checked- equal and bilateral Tube secured with: Tape

## 2019-01-20 NOTE — Anesthesia Postprocedure Evaluation (Signed)
Anesthesia Post Note  Patient: Alec Larson  Procedure(s) Performed: CYSTOSCOPY WITH INSERTION OF UROLIFT (N/A )  Patient location during evaluation: PACU Anesthesia Type: General Level of consciousness: awake, oriented, awake and alert and patient cooperative Pain management: pain level controlled Vital Signs Assessment: post-procedure vital signs reviewed and stable Respiratory status: spontaneous breathing, respiratory function stable and nonlabored ventilation Cardiovascular status: stable Postop Assessment: no apparent nausea or vomiting Anesthetic complications: no     Last Vitals:  Vitals:   01/20/19 1120  BP: (!) 186/76  Pulse: 75  Resp: 18  Temp: 36.9 C  SpO2: 97%    Last Pain:  Vitals:   01/20/19 1120  TempSrc: Oral  PainSc: 0-No pain                 Adahlia Stembridge

## 2019-01-20 NOTE — Transfer of Care (Signed)
Immediate Anesthesia Transfer of Care Note  Patient: Alec Larson  Procedure(s) Performed: CYSTOSCOPY WITH INSERTION OF UROLIFT (N/A )  Patient Location: PACU  Anesthesia Type:General  Level of Consciousness: awake, alert , oriented and patient cooperative  Airway & Oxygen Therapy: Patient Spontanous Breathing  Post-op Assessment: Report given to RN, Post -op Vital signs reviewed and stable and Patient moving all extremities X 4  Post vital signs: Reviewed and stable  Last Vitals:  Vitals Value Taken Time  BP    Temp    Pulse    Resp    SpO2      Last Pain:  Vitals:   01/20/19 1120  TempSrc: Oral  PainSc: 0-No pain      Patients Stated Pain Goal: 6 ([id number])  Complications: No apparent anesthesia complications

## 2019-01-20 NOTE — Discharge Instructions (Signed)
Indwelling Urinary Catheter Care, Adult °An indwelling urinary catheter is a thin tube that is put into your bladder. The tube helps to drain pee (urine) out of your body. The tube goes in through your urethra. Your urethra is where pee comes out of your body. Your pee will come out through the catheter, then it will go into a bag (drainage bag). °Take good care of your catheter so it will work well. °How to wear your catheter and bag °Supplies needed °· Sticky tape (adhesive tape) or a leg strap. °· Alcohol wipe or soap and water (if you use tape). °· A clean towel (if you use tape). °· Large overnight bag. °· Smaller bag (leg bag). °Wearing your catheter °Attach your catheter to your leg with tape or a leg strap. °· Make sure the catheter is not pulled tight. °· If a leg strap gets wet, take it off and put on a dry strap. °· If you use tape to hold the bag on your leg: °1. Use an alcohol wipe or soap and water to wash your skin where the tape made it sticky before. °2. Use a clean towel to pat-dry that skin. °3. Use new tape to make the bag stay on your leg. °Wearing your bags °You should have been given a large overnight bag. °· You may wear the overnight bag in the day or night. °· Always have the overnight bag lower than your bladder.  Do not let the bag touch the floor. °· Before you go to sleep, put a clean plastic bag in a wastebasket. Then hang the overnight bag inside the wastebasket. °You should also have a smaller leg bag that fits under your clothes. °· Always wear the leg bag below your knee. °· Do not wear your leg bag at night. °How to care for your skin and catheter °Supplies needed °· A clean washcloth. °· Water and mild soap. °· A clean towel. °Caring for your skin and catheter ° °  ° °· Clean the skin around your catheter every day: °? Wash your hands with soap and water. °? Wet a clean washcloth in warm water and mild soap. °? Clean the skin around your urethra. °? If you are male: °? Gently  spread the folds of skin around your vagina (labia). °? With the washcloth in your other hand, wipe the inner side of your labia on each side. Wipe from front to back. °? If you are male: °? Pull back any skin that covers the end of your penis (foreskin). °? With the washcloth in your other hand, wipe your penis in small circles. Start wiping at the tip of your penis, then move away from the catheter. °? Move the foreskin back in place, if needed. °? With your free hand, hold the catheter close to where it goes into your body. °? Keep holding the catheter during cleaning so it does not get pulled out. °? With the washcloth in your other hand, clean the catheter. °? Only wipe downward on the catheter. °? Do not wipe upward toward your body. Doing this may push germs into your urethra and cause infection. °? Use a clean towel to pat-dry the catheter and the skin around it. Make sure to wipe off all soap. °? Wash your hands with soap and water. °· Shower every day. Do not take baths. °· Do not use cream, ointment, or lotion on the area where the catheter goes into your body, unless your doctor tells you   to. °· Do not use powders, sprays, or lotions on your genital area. °· Check your skin around the catheter every day for signs of infection. Check for: °? Redness, swelling, or pain. °? Fluid or blood. °? Warmth. °? Pus or a bad smell. °How to empty the bag °Supplies needed °· Rubbing alcohol. °· Gauze pad or cotton ball. °· Tape or a leg strap. °Emptying the bag °Pour the pee out of your bag when it is ?-½ full, or at least 2-3 times a day. Do this for your overnight bag and your leg bag. °1. Wash your hands with soap and water. °2. Separate (detach) the bag from your leg. °3. Hold the bag over the toilet or a clean pail. Keep the bag lower than your hips and bladder. This is so the pee (urine) does not go back into the tube. °4. Open the pour spout. It is at the bottom of the bag. °5. Empty the pee into the toilet or  pail. Do not let the pour spout touch any surface. °6. Put rubbing alcohol on a gauze pad or cotton ball. °7. Use the gauze pad or cotton ball to clean the pour spout. °8. Close the pour spout. °9. Attach the bag to your leg with tape or a leg strap. °10. Wash your hands with soap and water. °Follow instructions for cleaning the drainage bag: °· From the product maker. °· As told by your doctor. °How to change the bag °Supplies needed °· Alcohol wipes. °· A clean bag. °· Tape or a leg strap. °Changing the bag °Replace your bag when it starts to leak, smell bad, or look dirty. °1. Wash your hands with soap and water. °2. Separate the dirty bag from your leg. °3. Pinch the catheter with your fingers so that pee does not spill out. °4. Separate the catheter tube from the bag tube where these tubes connect (at the connection valve). Do not let the tubes touch any surface. °5. Clean the end of the catheter tube with an alcohol wipe. Use a different alcohol wipe to clean the end of the bag tube. °6. Connect the catheter tube to the tube of the clean bag. °7. Attach the clean bag to your leg with tape or a leg strap. Do not make the bag tight on your leg. °8. Wash your hands with soap and water. °General rules ° °· Never pull on your catheter. Never try to take it out. Doing that can hurt you. °· Always wash your hands before and after you touch your catheter or bag. Use a mild, fragrance-free soap. If you do not have soap and water, use hand sanitizer. °· Always make sure there are no twists or bends (kinks) in the catheter tube. °· Always make sure there are no leaks in the catheter or bag. °· Drink enough fluid to keep your pee pale yellow. °· Do not take baths, swim, or use a hot tub. °· If you are male, wipe from front to back after you poop (have a bowel movement). °Contact a doctor if: °· Your pee is cloudy. °· Your pee smells worse than usual. °· Your catheter gets clogged. °· Your catheter leaks. °· Your bladder  feels full. °Get help right away if: °· You have redness, swelling, or pain where the catheter goes into your body. °· You have fluid, blood, pus, or a bad smell coming from the area where the catheter goes into your body. °· Your skin feels warm where   the catheter goes into your body.  You have a fever.  You have pain in your: ? Belly (abdomen). ? Legs. ? Lower back. ? Bladder.  You see blood in the catheter.  Your pee is pink or red.  You feel sick to your stomach (nauseous).  You throw up (vomit).  You have chills.  Your pee is not draining into the bag.  Your catheter gets pulled out. Summary  An indwelling urinary catheter is a thin tube that is placed into the bladder to help drain pee (urine) out of the body.  The catheter is placed into the part of the body that drains pee from the bladder (urethra).  Taking good care of your catheter will keep it working properly and help prevent problems.  Always wash your hands before and after touching your catheter or bag.  Never pull on your catheter or try to take it out. This information is not intended to replace advice given to you by your health care provider. Make sure you discuss any questions you have with your health care provider. Document Released: 05/25/2012 Document Revised: 05/22/2018 Document Reviewed: 09/13/2016 Elsevier Patient Education  2020 [employer].   Cystoscopy Cystoscopy is a procedure that is used to help diagnose and sometimes treat conditions that affect the lower urinary tract. The lower urinary tract includes the bladder and the urethra. The urethra is the tube that drains urine from the bladder. Cystoscopy is done using a thin, tube-shaped instrument with a light and camera at the end (cystoscope). The cystoscope may be hard or flexible, depending on the goal of the procedure. The cystoscope is inserted through the urethra, into the bladder. Cystoscopy may be recommended if you have:  Urinary  tract infections that keep coming back.  Blood in the urine (hematuria).  An inability to control when you urinate (urinary incontinence) or an overactive bladder.  Unusual cells found in a urine sample.  A blockage in the urethra, such as a urinary stone.  Painful urination.  An abnormality in the bladder found during an intravenous pyelogram (IVP) or CT scan. Cystoscopy may also be done to remove a sample of tissue to be examined under a microscope (biopsy). Tell a health care provider about:  Any allergies you have.  All medicines you are taking, including vitamins, herbs, eye drops, creams, and over-the-counter medicines.  Any problems you or family members have had with anesthetic medicines.  Any blood disorders you have.  Any surgeries you have had.  Any medical conditions you have.  Whether you are pregnant or may be pregnant. What are the risks? Generally, this is a safe procedure. However, problems may occur, including:  Infection.  Bleeding.  Allergic reactions to medicines.  Damage to other structures or organs. What happens before the procedure?  Ask your health care provider about: ? Changing or stopping your regular medicines. This is especially important if you are taking diabetes medicines or blood thinners. ? Taking medicines such as aspirin and ibuprofen. These medicines can thin your blood. Do not take these medicines unless your health care provider tells you to take them. ? Taking over-the-counter medicines, vitamins, herbs, and supplements.  Follow instructions from your health care provider about eating or drinking restrictions.  Ask your health care provider what steps will be taken to help prevent infection. These may include: ? Washing skin with a germ-killing soap. ? Taking antibiotic medicine.  You may have an exam or testing, such as: ? X-rays of the bladder,  urethra, or kidneys. ? Urine tests to check for signs of infection.  Plan  to have someone take you home from the hospital or clinic. What happens during the procedure?   You will be given one or more of the following: ? A medicine to help you relax (sedative). ? A medicine to numb the area (local anesthetic).  The area around the opening of your urethra will be cleaned.  The cystoscope will be passed through your urethra into your bladder.  Germ-free (sterile) fluid will flow through the cystoscope to fill your bladder. The fluid will stretch your bladder so that your health care provider can clearly examine your bladder walls.  Your doctor will look at the urethra and bladder. Your doctor may take a biopsy or remove stones.  The cystoscope will be removed, and your bladder will be emptied. The procedure may vary among health care providers and hospitals. What can I expect after the procedure? After the procedure, it is common to have:  Some soreness or pain in your abdomen and urethra.  Urinary symptoms. These include: ? Mild pain or burning when you urinate. Pain should stop within a few minutes after you urinate. This may last for up to 1 week. ? A small amount of blood in your urine for several days. ? Feeling like you need to urinate but producing only a small amount of urine. Follow these instructions at home: Medicines  Take over-the-counter and prescription medicines only as told by your health care provider.  If you were prescribed an antibiotic medicine, take it as told by your health care provider. Do not stop taking the antibiotic even if you start to feel better. General instructions  Return to your normal activities as told by your health care provider. Ask your health care provider what activities are safe for you.  Do not drive for 24 hours if you were given a sedative during your procedure.  Watch for any blood in your urine. If the amount of blood in your urine increases, call your health care provider.  Follow instructions from  your health care provider about eating or drinking restrictions.  If a tissue sample was removed for testing (biopsy) during your procedure, it is up to you to get your test results. Ask your health care provider, or the department that is doing the test, when your results will be ready.  Drink enough fluid to keep your urine pale yellow.  Keep all follow-up visits as told by your health care provider. This is important. Contact a health care provider if you:  Have pain that gets worse or does not get better with medicine, especially pain when you urinate.  Have trouble urinating.  Have more blood in your urine. Get help right away if you:  Have blood clots in your urine.  Have abdominal pain.  Have a fever or chills.  Are unable to urinate. Summary  Cystoscopy is a procedure that is used to help diagnose and sometimes treat conditions that affect the lower urinary tract.  Cystoscopy is done using a thin, tube-shaped instrument with a light and camera at the end.  After the procedure, it is common to have some soreness or pain in your abdomen and urethra.  Watch for any blood in your urine. If the amount of blood in your urine increases, call your health care provider.  If you were prescribed an antibiotic medicine, take it as told by your health care provider. Do not stop taking the antibiotic  even if you start to feel better. This information is not intended to replace advice given to you by your health care provider. Make sure you discuss any questions you have with your health care provider. Document Released: 01/26/2000 Document Revised: 01/20/2018 Document Reviewed: 01/20/2018 Elsevier Patient Education  [place], Adult Taking care of your wound properly can help to prevent pain, infection, and scarring. It can also help your wound to heal more quickly. How to care for your wound Wound care      Follow instructions from your health care  provider about how to take care of your wound. Make sure you: ? Wash your hands with soap and water before you change the bandage (dressing). If soap and water are not available, use hand sanitizer. ? Change your dressing as told by your health care provider. ? Leave stitches (sutures), skin glue, or adhesive strips in place. These skin closures may need to stay in place for 2 weeks or longer. If adhesive strip edges start to loosen and curl up, you may trim the loose edges. Do not remove adhesive strips completely unless your health care provider tells you to do that.  Check your wound area every day for signs of infection. Check for: ? Redness, swelling, or pain. ? Fluid or blood. ? Warmth. ? Pus or a bad smell.  Ask your health care provider if you should clean the wound with mild soap and water. Doing this may include: ? Using a clean towel to pat the wound dry after cleaning it. Do not rub or scrub the wound. ? Applying a cream or ointment. Do this only as told by your health care provider. ? Covering the incision with a clean dressing.  Ask your health care provider when you can leave the wound uncovered.  Keep the dressing dry until your health care provider says it can be removed. Do not take baths, swim, use a hot tub, or do anything that would put the wound underwater until your health care provider approves. Ask your health care provider if you can take showers. You may only be allowed to take sponge baths. Medicines   If you were prescribed an antibiotic medicine, cream, or ointment, take or use the antibiotic as told by your health care provider. Do not stop taking or using the antibiotic even if your condition improves.  Take over-the-counter and prescription medicines only as told by your health care provider. If you were prescribed pain medicine, take it 30 or more minutes before you do any wound care or as told by your health care provider. General instructions  Return to  your normal activities as told by your health care provider. Ask your health care provider what activities are safe.  Do not scratch or pick at the wound.  Do not use any products that contain nicotine or tobacco, such as cigarettes and e-cigarettes. These may delay wound healing. If you need help quitting, ask your health care provider.  Keep all follow-up visits as told by your health care provider. This is important.  Eat a diet that includes protein, vitamin A, vitamin C, and other nutrient-rich foods to help the wound heal. ? Foods rich in protein include meat, dairy, beans, nuts, and other sources. ? Foods rich in vitamin A include carrots and dark green, leafy vegetables. ? Foods rich in vitamin C include citrus, tomatoes, and other fruits and vegetables. ? Nutrient-rich foods have protein, carbohydrates, fat, vitamins, or minerals. Eat  a variety of healthy foods including vegetables, fruits, and whole grains. Contact a health care provider if:  You received a tetanus shot and you have swelling, severe pain, redness, or bleeding at the injection site.  Your pain is not controlled with medicine.  You have redness, swelling, or pain around the wound.  You have fluid or blood coming from the wound.  Your wound feels warm to the touch.  You have pus or a bad smell coming from the wound.  You have a fever or chills.  You are nauseous or you vomit.  You are dizzy. Get help right away if:  You have a red streak going away from your wound.  The edges of the wound open up and separate.  Your wound is bleeding, and the bleeding does not stop with gentle pressure.  You have a rash.  You faint.  You have trouble breathing. Summary  Always wash your hands with soap and water before changing your bandage (dressing).  To help with healing, eat foods that are rich in protein, vitamin A, vitamin C, and other nutrients.  Check your wound every day for signs of infection.  Contact your health care provider if you suspect that your wound is infected. This information is not intended to replace advice given to you by your health care provider. Make sure you discuss any questions you have with your health care provider. Document Released: 11/07/2007 Document Revised: 05/18/2018 Document Reviewed: 08/15/2015 Elsevier Patient Education  2020 [employer].

## 2019-01-20 NOTE — H&P (Signed)
Urology Admission H&P  Chief Complaint: urinary frequency and urgency  History of Present Illness: Mr Alec Larson is a 73yo with a hx of BPH here for Urolift. He has severe LUTS refractory to medical therapy. No fevers/chills/sweats  Past Medical History:  Diagnosis Date  . Anxiety   . BPH (benign prostatic hyperplasia)   . Diabetes mellitus without complication ([place])   . GERD (gastroesophageal reflux disease)   . H/O renal calculi   . Hypercholesteremia   . Hypertension    Past Surgical History:  Procedure Laterality Date  . CERVICAL SPINE SURGERY    . TONSILLECTOMY     age 58    Home Medications:  Current Facility-Administered Medications  Medication Dose Route Frequency Provider Last Rate Last Dose  . ceFAZolin (ANCEF) IVPB 2g/100 mL premix  2 g Intravenous 30 min Pre-Op Dyneisha Murchison, Candee Furbish, MD       Allergies:  Allergies  Allergen Reactions  . Penicillins Hives  . Aspirin Hives    History reviewed. No pertinent family history. Social History:  reports that he has quit smoking. He does not have any smokeless tobacco history on file. He reports previous alcohol use. He reports that he does not use drugs.  Review of Systems  Genitourinary: Positive for frequency and urgency.  All other systems reviewed and are negative.   Physical Exam:  Vital signs in last 24 hours: Temp:  [98.4 F (36.9 C)] 98.4 F (36.9 C) (12/09 1120) Pulse Rate:  [75] 75 (12/09 1120) Resp:  [18] 18 (12/09 1120) BP: (186)/(76) 186/76 (12/09 1120) SpO2:  [97 %] 97 % (12/09 1120) Physical Exam  Constitutional: He is oriented to person, place, and time. He appears well-developed and well-nourished.  HENT:  Head: Normocephalic and atraumatic.  Eyes: Pupils are equal, round, and reactive to light. EOM are normal.  Neck: Normal range of motion. No thyromegaly present.  Cardiovascular: Normal rate and regular rhythm.  Respiratory: Effort normal. No respiratory distress.  GI: Soft. He exhibits no  distension.  Musculoskeletal: Normal range of motion.        General: No edema.  Neurological: He is alert and oriented to person, place, and time.  Skin: Skin is warm and dry. He is not diaphoretic.  Psychiatric: He has a normal mood and affect. His behavior is normal. Judgment and thought content normal.    Laboratory Data:  Results for orders placed or performed during the hospital encounter of 01/20/19 (from the past 24 hour(s))  Glucose, capillary     Status: Abnormal   Collection Time: 01/20/19 12:04 PM  Result Value Ref Range   Glucose-Capillary 201 (H) 70 - 99 mg/dL   Recent Results (from the past 240 hour(s))  SARS CORONAVIRUS 2 (TAT 6-24 HRS) Nasopharyngeal Nasopharyngeal Swab     Status: None   Collection Time: 01/19/19  7:22 AM   Specimen: Nasopharyngeal Swab  Result Value Ref Range Status   SARS Coronavirus 2 NEGATIVE NEGATIVE Final    Comment: (NOTE) SARS-CoV-2 target nucleic acids are NOT DETECTED. The SARS-CoV-2 RNA is generally detectable in upper and lower respiratory specimens during the acute phase of infection. Negative results do not preclude SARS-CoV-2 infection, do not rule out co-infections with other pathogens, and should not be used as the sole basis for treatment or other patient management decisions. Negative results must be combined with clinical observations, patient history, and epidemiological information. The expected result is Negative. Fact Sheet for Patients: SugarRoll[employer] Fact Sheet for Healthcare Providers: https://www.woods-mathews.com/ This test is  not yet approved or cleared by the [place] and  has been authorized for detection and/or diagnosis of SARS-CoV-2 by FDA under an Emergency Use Authorization (EUA). This EUA will remain  in effect (meaning this test can be used) for the duration of the COVID-19 declaration under Section 56 4(b)(1) of the Act, 21 U.S.C. section 360bbb-3(b)(1),  unless the authorization is terminated or revoked sooner. Performed at [facility], [place] [address] [place], [place] [address]    Creatinine: Recent Labs    01/15/19 1117  CREATININE 1.25*   Baseline Creatinine: 1.2  Impression/Assessment:  73yo with BPH with LUTS, urinary urgency  Plan:  The risks/benefits/alteranitives to Urolift placement was explained to the patient and he understands and wishes to proceed with surgery  Nicolette Bang 01/20/2019, 12:26 PM

## 2019-01-20 NOTE — Op Note (Signed)
   PREOPERATIVE DIAGNOSIS: Benign prostatic hypertrophy with bladder outlet obstruction.  POSTOPERATIVE DIAGNOSIS: Benign prostatic hypertrophy with bladder outlet obstruction.  PROCEDURE: Cystoscopy with implantation of UroLift devices, 6 implants.  SURGEON: Nicolette Bang, M.D.  ANESTHESIA: General  ANTIBIOTICS: ancef  SPECIMEN: None.  DRAINS: A 16-French Foley catheter.  BLOOD LOSS: Minimal.  COMPLICATIONS: None.  INDICATIONS:The Patient is an 73 year old male with BPH and bladder outlet obstruction. He has failed medical therapy and has elected UroLift for definitive treatment.  FINDINGS OF PROCEDURE: He was taken to the operating room where a genral anesthetic was induced. He was placed in lithotomy position and was fitted with PAS hose. His perineum and genitalia were prepped with chlorhexidine, and he was draped in usual sterile fashion.  Cystoscopy was performed using the UroLift scope and 0 degree lens. Examination revealed a normal urethra. The external sphincter was intact. Prostatic urethra was approximately 4 cm in length with lateral lobe enlargement. There was also little bit of bladder neck elevation. Inspection of bladder revealed mild-to-moderate trabeculation with no tumors, stones, or inflammation. No cellules or diverticula were noted. Ureteral orifices were in their normal anatomic position effluxing clear urine.  After initial cystoscopy, the visual obturator was replaced with the first UroLift device. This was turned to the 9 o'clock position and pulled back to the veru and then slightly advanced. Pressure was then applied to the right lateral lobe and the UroLift device was deployed.  The second UroLift device was then inserted and applied to the left lateral lobe at 3 o'clock and deployed in the mid prostatic urethra. After this, there was still some apparent obstruction closer to the bladder neck. So a second level of  UroLift your left device was applied between the mid urethra and the proximal urethra providing further patency to the prostatic urethra. At this point, there was mild bleeding but the patient did have a spinal anesthetic. So it was thought that a Foley catheter was indicated. The scope was removed and a 16-French Foley catheter was inserted without difficulty. The balloon was filled with 10 mL sterile fluid, and the catheter was placed to straight drainage.  COMPLICATIONS: None   CONDITION: Stable, extubated, transferred to PACU  PLAN: The patient will be discharged home and followup in 2 days for a voiding trial.

## 2019-01-20 NOTE — Anesthesia Preprocedure Evaluation (Signed)
Anesthesia Evaluation  Patient identified by MRN, date of birth, ID band Patient awake    Reviewed: Allergy & Precautions, NPO status , Patient's Chart, lab work & pertinent test results  Airway Mallampati: II  TM Distance: >3 FB Neck ROM: Full    Dental  (+) Edentulous Upper, Edentulous Lower   Pulmonary former smoker,    Pulmonary exam normal breath sounds clear to auscultation       Cardiovascular METS: 3 - Mets hypertension, Pt. on medications and Pt. on home beta blockers Normal cardiovascular exam Rhythm:Regular Rate:Normal     Neuro/Psych Anxiety  Neuromuscular disease (lower extremity weakness)    GI/Hepatic Neg liver ROS, GERD  Medicated and Controlled,  Endo/Other  diabetes, Poorly Controlled, Type 2  Renal/GU   negative genitourinary   Musculoskeletal Neck fusion, neck pain, back pain,    Abdominal   Peds  Hematology   Anesthesia Other Findings   Reproductive/Obstetrics negative OB ROS                            Anesthesia Physical Anesthesia Plan  ASA: III  Anesthesia Plan: General   Post-op Pain Management:    Induction: Intravenous  PONV Risk Score and Plan: 3 and Ondansetron and Metaclopromide  Airway Management Planned: LMA  Additional Equipment:   Intra-op Plan:   Post-operative Plan: Extubation in OR  Informed Consent: I have reviewed the patients History and Physical, chart, labs and discussed the procedure including the risks, benefits and alternatives for the proposed anesthesia with the patient or authorized representative who has indicated his/her understanding and acceptance.     Dental advisory given  Plan Discussed with: CRNA  Anesthesia Plan Comments:        Anesthesia Quick Evaluation

## 2019-01-22 ENCOUNTER — Ambulatory Visit (INDEPENDENT_AMBULATORY_CARE_PROVIDER_SITE_OTHER): Payer: Medicare PPO | Admitting: Urology

## 2019-01-22 ENCOUNTER — Other Ambulatory Visit: Payer: Self-pay

## 2019-01-22 DIAGNOSIS — N401 Enlarged prostate with lower urinary tract symptoms: Secondary | ICD-10-CM

## 2019-02-03 ENCOUNTER — Other Ambulatory Visit: Payer: Self-pay

## 2019-02-03 ENCOUNTER — Ambulatory Visit (INDEPENDENT_AMBULATORY_CARE_PROVIDER_SITE_OTHER): Payer: Medicare PPO | Admitting: Urology

## 2019-02-03 ENCOUNTER — Encounter: Payer: Self-pay | Admitting: Urology

## 2019-02-03 VITALS — BP 199/74 | HR 78 | Temp 97.5°F | Ht 63.0 in | Wt 215.0 lb

## 2019-02-03 DIAGNOSIS — N401 Enlarged prostate with lower urinary tract symptoms: Secondary | ICD-10-CM | POA: Diagnosis not present

## 2019-02-03 DIAGNOSIS — N138 Other obstructive and reflux uropathy: Secondary | ICD-10-CM | POA: Diagnosis not present

## 2019-02-03 DIAGNOSIS — R339 Retention of urine, unspecified: Secondary | ICD-10-CM

## 2019-02-03 NOTE — Progress Notes (Signed)
02/03/2019 9:31 AM   Alec Larson September 04, 1945 [id number]  Referring provider: No referring provider defined for this encounter.  Nocturia  HPI: Alec Larson is a 73yo with a hx of BPH with severe LUTS. S/p Urolift 2 weeks. His nocturia worsened to 3x and now is 1x. Urgency and frequency resolve. No hematuria. No dysuria. No pelvic pain. No exacerbating/alleviaitng events. No other associated symptoms. IPSS 10   PMH: Past Medical History:  Diagnosis Date  . Anxiety   . BPH (benign prostatic hyperplasia)   . Diabetes mellitus without complication ([place])   . GERD (gastroesophageal reflux disease)   . H/O renal calculi   . Hypercholesteremia   . Hypertension     Surgical History: Past Surgical History:  Procedure Laterality Date  . CERVICAL SPINE SURGERY    . CYSTOSCOPY WITH INSERTION OF UROLIFT N/A 01/20/2019   Procedure: CYSTOSCOPY WITH INSERTION OF UROLIFT;  Surgeon: Cleon Gustin, MD;  Location: [facility];  Service: Urology;  Laterality: N/A;  30 MINS  . TONSILLECTOMY     age 26    Home Medications:  Allergies as of 02/03/2019      Reactions   Penicillins Hives   Aspirin Hives      Medication List       Accurate as of February 03, 2019  9:31 AM. If you have any questions, ask your nurse or doctor.        amLODipine 10 MG tablet Commonly known as: NORVASC Take 10 mg by mouth daily.   atorvastatin 40 MG tablet Commonly known as: LIPITOR Take 40 mg by mouth daily.   azelastine 0.05 % ophthalmic solution Commonly known as: OPTIVAR Place 1 drop into both eyes 2 (two) times daily.   benazepril 40 MG tablet Commonly known as: LOTENSIN Take 40 mg by mouth daily.   cyanocobalamin 1000 MCG tablet Take by mouth.   DULoxetine 60 MG capsule Commonly known as: CYMBALTA Take 60 mg by mouth daily.   fluticasone 50 MCG/ACT nasal spray Commonly known as: FLONASE Place 2 sprays into both nostrils daily.   fluticasone 50 MCG/ACT nasal spray Commonly known as:  FLONASE Place into the nose.   gabapentin 300 MG capsule Commonly known as: NEURONTIN Take 300 mg by mouth 3 (three) times daily.   glimepiride 4 MG tablet Commonly known as: AMARYL Take 4 mg by mouth daily with breakfast.   hydrochlorothiazide 25 MG tablet Commonly known as: HYDRODIURIL Take 25 mg by mouth daily.   metFORMIN 1000 MG tablet Commonly known as: GLUCOPHAGE Take 1,000 mg by mouth 2 (two) times daily with a meal.   metoprolol tartrate 50 MG tablet Commonly known as: LOPRESSOR Take 50 mg by mouth 2 (two) times daily with a meal.   NovoLIN 70/30 (70-30) 100 UNIT/ML injection Generic drug: insulin NPH-regular Human Inject 35-64 Units into the skin See admin instructions. Inject 35 units into the skin in the morning and 64 units at night   omeprazole 20 MG capsule Commonly known as: PRILOSEC Take 20 mg by mouth every other day.   oxyCODONE-acetaminophen 10-325 MG tablet Commonly known as: PERCOCET Take 1 tablet by mouth every 6 (six) hours as needed for pain. What changed: Another medication with the same name was removed. Continue taking this medication, and follow the directions you see here. Changed by: Nicolette Bang, MD   TRUEplus Insulin Syringe 30G X 5/16" 1 ML Misc Generic drug: Insulin Syringe-Needle U-100       Allergies:  Allergies  Allergen  Reactions  . Penicillins Hives  . Aspirin Hives    Family History: No family history on file.  Social History:  reports that he has quit smoking. He does not have any smokeless tobacco history on file. He reports previous alcohol use. He reports that he does not use drugs.  ROS: Urological Symptom Review  Patient is experiencing the following symptoms: Get up at night to urinate   Review of Systems  Gastrointestinal (upper)  : Negative for upper GI symptoms  Gastrointestinal (lower) : Negative for lower GI symptoms  Constitutional : Negative for symptoms  Skin: Negative for skin  symptoms  Eyes: Negative for eye symptoms  Ear/Nose/Throat : Negative for Ear/Nose/Throat symptoms  Hematologic/Lymphatic: Negative for Hematologic/Lymphatic symptoms  Cardiovascular : Negative for cardiovascular symptoms  Respiratory : Negative for respiratory symptoms  Endocrine: Negative for endocrine symptoms  Musculoskeletal: Negative for musculoskeletal symptoms  Neurological: Negative for neurological symptoms  Psychologic: Negative for psychiatric symptoms                                         Physical Exam: BP (!) 199/74   Pulse 78   Temp (!) 97.5 F (36.4 C)   Ht 5\' 3"  (1.6 m)   Wt 215 lb (97.5 kg)   BMI 38.09 kg/m   Constitutional:  Alert and oriented, No acute distress. HEENT: [place] AT, moist mucus membranes.  Trachea midline, no masses. Cardiovascular: No clubbing, cyanosis, or edema. Respiratory: Normal respiratory effort, no increased work of breathing. GI: Abdomen is soft, nontender, nondistended, no abdominal masses GU: No CVA tenderness Lymph: No cervical or inguinal lymphadenopathy. Skin: No rashes, bruises or suspicious lesions. Neurologic: Grossly intact, no focal deficits, moving all 4 extremities. Psychiatric: Normal mood and affect.  Laboratory Data: Lab Results  Component Value Date   WBC 11.0 (H) 01/15/2019   HGB 13.7 01/15/2019   HCT 42.5 01/15/2019   MCV 89.3 01/15/2019   PLT 295 01/15/2019    Lab Results  Component Value Date   CREATININE 1.25 (H) 01/15/2019    No results found for: PSA  No results found for: TESTOSTERONE  Lab Results  Component Value Date   HGBA1C 8.8 (H) 01/15/2019    Urinalysis No results found for: COLORURINE, APPEARANCEUR, LABSPEC, PHURINE, GLUCOSEU, HGBUR, BILIRUBINUR, KETONESUR, PROTEINUR, UROBILINOGEN, NITRITE, LEUKOCYTESUR  No results found for: LABMICR, Pill[place], RBCUA, LABEPIT, MUCUS, BACTERIA  Pertinent Imaging: No results found for this or any previous  visit. No results found for this or any previous visit. No results found for this or any previous visit. No results found for this or any previous visit. No results found for this or any previous visit. No results found for this or any previous visit. No results found for this or any previous visit. No results found for this or any previous visit.  Assessment & Plan:    BPh with LUTS, incomplete emptying: -resolved -RTC 6 months  No follow-ups on file.  Nicolette Bang, MD  [facility] [place]

## 2019-02-03 NOTE — Patient Instructions (Signed)

## 2019-02-11 ENCOUNTER — Other Ambulatory Visit: Payer: Self-pay

## 2019-02-11 ENCOUNTER — Ambulatory Visit: Payer: Medicare PPO | Attending: Internal Medicine

## 2019-02-11 DIAGNOSIS — Z20828 Contact with and (suspected) exposure to other viral communicable diseases: Secondary | ICD-10-CM | POA: Insufficient documentation

## 2019-02-11 DIAGNOSIS — Z20822 Contact with and (suspected) exposure to covid-19: Secondary | ICD-10-CM

## 2019-02-13 LAB — NOVEL CORONAVIRUS, NAA: SARS-CoV-2, NAA: NOT DETECTED

## 2019-02-15 ENCOUNTER — Telehealth: Payer: Self-pay

## 2019-02-15 NOTE — Telephone Encounter (Signed)
Called and informed patient that test for Covid 19 was NEGATIVE. Discussed signs and symptoms of Covid 19 : fever, chills, respiratory symptoms, cough, ENT symptoms, sore throat, SOB, muscle pain, diarrhea, headache, loss of taste/smell, close exposure to COVID-19 patient. Pt instructed to call PCP if they develop the above signs and sx. Pt also instructed to call 911 if having respiratory issues/distress. . Pt verbalized understanding.  Chart needs to be merged.

## 2019-02-18 ENCOUNTER — Ambulatory Visit (INDEPENDENT_AMBULATORY_CARE_PROVIDER_SITE_OTHER): Payer: Medicare PPO | Admitting: Gastroenterology

## 2019-02-18 ENCOUNTER — Other Ambulatory Visit: Payer: Self-pay

## 2019-02-18 ENCOUNTER — Encounter (INDEPENDENT_AMBULATORY_CARE_PROVIDER_SITE_OTHER): Payer: Self-pay | Admitting: Gastroenterology

## 2019-02-18 VITALS — BP 217/89 | HR 76 | Temp 98.2°F | Ht 65.0 in | Wt 218.4 lb

## 2019-02-18 DIAGNOSIS — R197 Diarrhea, unspecified: Secondary | ICD-10-CM

## 2019-02-18 DIAGNOSIS — R1310 Dysphagia, unspecified: Secondary | ICD-10-CM | POA: Diagnosis not present

## 2019-02-18 DIAGNOSIS — Z8371 Family history of colonic polyps: Secondary | ICD-10-CM

## 2019-02-18 DIAGNOSIS — K219 Gastro-esophageal reflux disease without esophagitis: Secondary | ICD-10-CM | POA: Diagnosis not present

## 2019-02-18 MED ORDER — DICYCLOMINE HCL 10 MG PO CAPS: 10.0000 mg | ORAL_CAPSULE | Freq: Three times a day (TID) | ORAL | 0 refills | Status: DC | PRN

## 2019-02-18 NOTE — Patient Instructions (Signed)
We are treating stool studies for infection.  You can use the dicyclomine as needed when you have diarrhea or when you are doing errands.  Please contact us if it worsens.  We are scheduling endoscopy & colonoscopy for evaluation.  Please call with any health changes prior

## 2019-02-18 NOTE — Progress Notes (Signed)
Patient profile: Alec Larson is a 74 y.o. male seen for evaluation of diarrhea and colonoscopy scheduling.    History of Present Illness: Alec Larson reports having a chronic history of diarrhea but may be worse over the past 1 year.  He reports some days will have 8-9 stools including very rare nocturnal diarrhea.  Stool consistency is very loose and can have issues with urgency when traveling and doing errands.  He reports when he takes oxycodone for his back pain his diarrhea will resolve and he will go 1 to 2 days without a bowel movement.  Using oxycodone approximately 3 times a week for back pain when he is active in the yard.  Denies any abdominal pain with the diarrhea.  Denies any rectal bleeding or melena.  Denies any diarrhea changes when starting Metformin.  Reports he tried a fiber supplement in the past without improvement.  He does not seem to have tried Imodium frequently.  He reports he is on Prilosec 20 mg daily but never recalls having any GERD symptoms.  He has had 2 significant episodes of dysphagia in the past year requiring the Heimlich maneuver, reports food sticking in upper esophageal area causing him to not be able to breathe well..  One episode was with ham.  Notes these began after he had surgery on his cervical spine many years ago.  Denies any dysphagia to liquids or pills.  No epigastric pain, nausea, vomiting.  He has never had endoscopy.   Reports he previously weighed 236 last year, he tried a high fat low-carb diet for few months lost down to 212, regained back to 218.   Wt Readings from Last 3 Encounters:  02/18/19 218 lb 6.4 oz (99.1 kg)  02/03/19 215 lb (97.5 kg)  01/15/19 213 lb (96.6 kg)     Last Colonoscopy: Per patient 2011 is due for 10-year repeat. Last Endoscopy: None prior.   Past Medical History:  Past Medical History:  Diagnosis Date  . Anxiety   . BPH (benign prostatic hyperplasia)   . Diabetes mellitus without complication ([place])   .  GERD (gastroesophageal reflux disease)   . H/O renal calculi   . Hypercholesteremia   . Hypertension     Problem List: Patient Active Problem List   Diagnosis Date Noted  . Benign prostatic hyperplasia with urinary obstruction 02/03/2019  . Incomplete emptying of bladder 02/03/2019    Past Surgical History: Past Surgical History:  Procedure Laterality Date  . CERVICAL SPINE SURGERY    . CYSTOSCOPY WITH INSERTION OF UROLIFT N/A 01/20/2019   Procedure: CYSTOSCOPY WITH INSERTION OF UROLIFT;  Surgeon: Cleon Gustin, MD;  Location: [facility];  Service: Urology;  Laterality: N/A;  30 MINS  . TONSILLECTOMY     age 13    Allergies: Allergies  Allergen Reactions  . Penicillins Hives  . Aspirin Hives      Home Medications:  Current Outpatient Medications:  .  amLODipine (NORVASC) 10 MG tablet, Take 10 mg by mouth daily., Disp: , Rfl:  .  atorvastatin (LIPITOR) 40 MG tablet, Take 40 mg by mouth daily., Disp: , Rfl:  .  azelastine (OPTIVAR) 0.05 % ophthalmic solution, Place 1 drop into both eyes 2 (two) times daily., Disp: , Rfl:  .  benazepril (LOTENSIN) 40 MG tablet, Take 40 mg by mouth daily., Disp: , Rfl:  .  cyanocobalamin 1000 MCG tablet, Take by mouth., Disp: , Rfl:  .  DULoxetine (CYMBALTA) 60 MG capsule, Take 60 mg  by mouth daily., Disp: , Rfl:  .  fluticasone (FLONASE) 50 MCG/ACT nasal spray, Place 2 sprays into both nostrils daily., Disp: , Rfl:  .  gabapentin (NEURONTIN) 300 MG capsule, Take 300 mg by mouth 3 (three) times daily., Disp: , Rfl:  .  glimepiride (AMARYL) 4 MG tablet, Take 4 mg by mouth daily with breakfast., Disp: , Rfl:  .  hydrochlorothiazide (HYDRODIURIL) 25 MG tablet, Take 25 mg by mouth daily., Disp: , Rfl:  .  metFORMIN (GLUCOPHAGE) 1000 MG tablet, Take 1,000 mg by mouth 2 (two) times daily with a meal., Disp: , Rfl:  .  metoprolol tartrate (LOPRESSOR) 50 MG tablet, Take 50 mg by mouth 2 (two) times daily with a meal., Disp: , Rfl:  .  NOVOLIN 70/30  (70-30) 100 UNIT/ML injection, Inject 35-64 Units into the skin See admin instructions. Inject 35 units into the skin in the morning and 64 units at night, Disp: , Rfl:  .  omeprazole (PRILOSEC) 20 MG capsule, Take 20 mg by mouth every other day., Disp: , Rfl:  .  oxyCODONE-acetaminophen (PERCOCET) 10-325 MG tablet, Take 1 tablet by mouth every 6 (six) hours as needed for pain., Disp: 15 tablet, Rfl: 0 .  TRUEPLUS INSULIN SYRINGE 30G X 5/16" 1 ML MISC, , Disp: , Rfl:  .  dicyclomine (BENTYL) 10 MG capsule, Take 1 capsule (10 mg total) by mouth 3 (three) times daily as needed (abd pain, diarrhea)., Disp: 30 capsule, Rfl: 0   Family History:  Reports brother had 3-4 colon polyps.  Denies any family history of colon polyp colic or colon cancer or other GI illnesses in his first-degree relatives.  Social History:   reports that he has quit smoking. He has never used smokeless tobacco. He reports previous alcohol use. He reports that he does not use drugs.   Review of Systems: Constitutional: Denies weight loss/weight gain  Eyes: No changes in vision. ENT: No oral lesions, sore throat.  GI: see HPI.  Heme/Lymph: No easy bruising.  CV: No chest pain.  GU: No hematuria.  Integumentary: No rashes.  Neuro: No headaches.  Psych: No depression/anxiety.  Endocrine: No heat/cold intolerance.  Allergic/Immunologic: No urticaria.  Resp: No cough, SOB.  Musculoskeletal: Positive for back pain after cutting wood in the yard   Physical Examination: BP elevated in clinic.  He reports severe whitecoat hypertension and he monitors this daily with results automatically being sent to [facility].  He will check when he gets home. BP (!) 217/89 (BP Location: Right Arm, Patient Position: Sitting, Cuff Size: Large)   Pulse 76   Temp 98.2 F (36.8 C) (Temporal)   Ht 5\' 5"  (1.651 m)   Wt 218 lb 6.4 oz (99.1 kg)   BMI 36.34 kg/m  Gen: NAD, alert and oriented x 4 HEENT: [place]ER[place], EOMI, Neck: supple, no  JVD Chest: CTA bilaterally, no wheezes, crackles, or other adventitious sounds CV: RRR, no m/g/c/r Abd: soft, NT, ND, +BS in all four quadrants; no HSM, guarding, ridigity, or rebound tenderness Ext: no edema, well perfused with 2+ pulses, Skin: no rash or lesions noted on observed skin Lymph: no noted LAD  Data:  01/2019 labs-Covid negative, BMP w/ glucose 217, Cr 1.25, GFR 56, A1c - 8.8%, CBC w/ WBC 11   Assessment/Plan: Mr. Crummie is a 74 y.o. male  Cray was seen today for new patient (initial visit).  Diagnoses and all orders for this visit:  Diarrhea, unspecified type -     Clostridium difficile  Toxin B, Qualitative, Real-Time PCR([facility]) -     Gastrointestinal Panel by PCR , Stool  Colon cancer screening  Family history of colonic polyps  Dysphagia, unspecified type  Other orders -     Procedural/ Surgical Case Request: COLONOSCOPY; Standing -     Procedural/ Surgical Case Request: COLONOSCOPY -     dicyclomine (BENTYL) 10 MG capsule; Take 1 capsule (10 mg total) by mouth 3 (three) times daily as needed (abd pain, diarrhea).    1.  Diarrhea-reports this has been chronic but seems worse recently.  He is having some nocturnal diarrhea so we will check stool studies as above.  Could consider random biopsy oncolonoscopy to exclude microscopic colitis.  Recent labs fairly unremarkable.  He does diabetes, medications could be contributing.  We will give him dicyclomine to use as needed given urgency is problematic when doing errands. Diet modifications reviewed.   2.  Dysphagia-2 episodes over past year requiring someone to do the Heimlich maneuver. Feels foods stick in upper esophageal area. No prior endoscopy.  Arrange at time of colonoscopy.  3.  GERD-asymptomatic on his PPI  Patient denies CP, SOB, and use of blood thinners. I discussed the risks and benefits of procedure including bleeding, perforation, infection, missed lesions, medication reactions and possible  hospitalization or surgery if complications. All questions answered.  Denies prior issues with sedation.   I personally performed the service, non-incident to. (WP)  Laurine Blazer, [facility]

## 2019-02-19 NOTE — Addendum Note (Signed)
Addended by: Laurine Blazer A on: 02/19/2019 07:53 AM   Modules accepted: Orders, SmartSet

## 2019-03-01 LAB — GASTROINTESTINAL PATHOGEN PANEL PCR
C. difficile Tox A/B, PCR: NOT DETECTED
Campylobacter, PCR: NOT DETECTED
Cryptosporidium, PCR: NOT DETECTED
E coli (ETEC) LT/ST PCR: NOT DETECTED
E coli (STEC) stx1/stx2, PCR: NOT DETECTED
E coli 0157, PCR: NOT DETECTED
Giardia lamblia, PCR: NOT DETECTED
Norovirus, PCR: NOT DETECTED
Rotavirus A, PCR: NOT DETECTED
Salmonella, PCR: NOT DETECTED
Shigella, PCR: NOT DETECTED

## 2019-03-01 LAB — CLOSTRIDIUM DIFFICILE TOXIN B, QUALITATIVE, REAL-TIME PCR: Toxigenic C. Difficile by PCR: NOT DETECTED

## 2019-03-09 ENCOUNTER — Telehealth (INDEPENDENT_AMBULATORY_CARE_PROVIDER_SITE_OTHER): Payer: Self-pay | Admitting: *Deleted

## 2019-03-09 MED ORDER — SUPREP BOWEL PREP KIT 17.5-3.13-1.6 GM/177ML PO SOLN: 1.0000 | Freq: Once | ORAL | 0 refills | Status: AC

## 2019-03-09 NOTE — Telephone Encounter (Signed)
rx sent to pharmacy

## 2019-03-09 NOTE — Telephone Encounter (Signed)
Patient needs suprep TCS/EGD sch'd 2/5

## 2019-03-10 ENCOUNTER — Other Ambulatory Visit (INDEPENDENT_AMBULATORY_CARE_PROVIDER_SITE_OTHER): Payer: Self-pay | Admitting: *Deleted

## 2019-03-10 DIAGNOSIS — R197 Diarrhea, unspecified: Secondary | ICD-10-CM

## 2019-03-10 DIAGNOSIS — Z8371 Family history of colonic polyps: Secondary | ICD-10-CM

## 2019-03-10 DIAGNOSIS — R1310 Dysphagia, unspecified: Secondary | ICD-10-CM

## 2019-03-10 DIAGNOSIS — Z83719 Family history of colon polyps, unspecified: Secondary | ICD-10-CM

## 2019-03-10 DIAGNOSIS — K219 Gastro-esophageal reflux disease without esophagitis: Secondary | ICD-10-CM

## 2019-03-11 ENCOUNTER — Encounter (INDEPENDENT_AMBULATORY_CARE_PROVIDER_SITE_OTHER): Payer: Self-pay | Admitting: *Deleted

## 2019-03-11 ENCOUNTER — Other Ambulatory Visit (INDEPENDENT_AMBULATORY_CARE_PROVIDER_SITE_OTHER): Payer: Self-pay | Admitting: *Deleted

## 2019-03-17 ENCOUNTER — Other Ambulatory Visit: Payer: Self-pay

## 2019-03-17 ENCOUNTER — Other Ambulatory Visit (HOSPITAL_COMMUNITY)
Admission: RE | Admit: 2019-03-17 | Discharge: 2019-03-17 | Disposition: A | Discharge status: Home / Self Care | Payer: Medicare PPO | Source: Ambulatory Visit | Attending: Internal Medicine | Admitting: Internal Medicine

## 2019-03-17 DIAGNOSIS — Z01812 Encounter for preprocedural laboratory examination: Secondary | ICD-10-CM | POA: Insufficient documentation

## 2019-03-17 DIAGNOSIS — Z20822 Contact with and (suspected) exposure to covid-19: Secondary | ICD-10-CM | POA: Diagnosis not present

## 2019-03-17 LAB — SARS CORONAVIRUS 2 (TAT 6-24 HRS): SARS Coronavirus 2: NEGATIVE

## 2019-03-19 ENCOUNTER — Encounter (HOSPITAL_COMMUNITY): Payer: Self-pay | Admitting: Internal Medicine

## 2019-03-19 ENCOUNTER — Ambulatory Visit (HOSPITAL_COMMUNITY)
Admission: RE | Admit: 2019-03-19 | Discharge: 2019-03-19 | Disposition: A | Discharge status: Home / Self Care | Payer: Medicare PPO | Attending: Internal Medicine | Admitting: Internal Medicine

## 2019-03-19 ENCOUNTER — Other Ambulatory Visit: Payer: Self-pay

## 2019-03-19 ENCOUNTER — Encounter (HOSPITAL_COMMUNITY)
Admission: RE | Disposition: A | Discharge status: Home / Self Care | Payer: Self-pay | Source: Home / Self Care | Attending: Internal Medicine

## 2019-03-19 DIAGNOSIS — I10 Essential (primary) hypertension: Secondary | ICD-10-CM | POA: Insufficient documentation

## 2019-03-19 DIAGNOSIS — N400 Enlarged prostate without lower urinary tract symptoms: Secondary | ICD-10-CM | POA: Diagnosis not present

## 2019-03-19 DIAGNOSIS — D125 Benign neoplasm of sigmoid colon: Secondary | ICD-10-CM | POA: Diagnosis not present

## 2019-03-19 DIAGNOSIS — R1310 Dysphagia, unspecified: Secondary | ICD-10-CM | POA: Diagnosis not present

## 2019-03-19 DIAGNOSIS — Z794 Long term (current) use of insulin: Secondary | ICD-10-CM | POA: Insufficient documentation

## 2019-03-19 DIAGNOSIS — R197 Diarrhea, unspecified: Secondary | ICD-10-CM | POA: Diagnosis not present

## 2019-03-19 DIAGNOSIS — G8929 Other chronic pain: Secondary | ICD-10-CM | POA: Diagnosis not present

## 2019-03-19 DIAGNOSIS — E7800 Pure hypercholesterolemia, unspecified: Secondary | ICD-10-CM | POA: Insufficient documentation

## 2019-03-19 DIAGNOSIS — K228 Other specified diseases of esophagus: Secondary | ICD-10-CM | POA: Diagnosis not present

## 2019-03-19 DIAGNOSIS — Z8371 Family history of colonic polyps: Secondary | ICD-10-CM

## 2019-03-19 DIAGNOSIS — F419 Anxiety disorder, unspecified: Secondary | ICD-10-CM | POA: Diagnosis not present

## 2019-03-19 DIAGNOSIS — Z87442 Personal history of urinary calculi: Secondary | ICD-10-CM | POA: Diagnosis not present

## 2019-03-19 DIAGNOSIS — Z87891 Personal history of nicotine dependence: Secondary | ICD-10-CM | POA: Diagnosis not present

## 2019-03-19 DIAGNOSIS — E119 Type 2 diabetes mellitus without complications: Secondary | ICD-10-CM | POA: Diagnosis not present

## 2019-03-19 DIAGNOSIS — M545 Low back pain: Secondary | ICD-10-CM | POA: Diagnosis not present

## 2019-03-19 DIAGNOSIS — K529 Noninfective gastroenteritis and colitis, unspecified: Secondary | ICD-10-CM | POA: Diagnosis not present

## 2019-03-19 DIAGNOSIS — Z79899 Other long term (current) drug therapy: Secondary | ICD-10-CM | POA: Insufficient documentation

## 2019-03-19 DIAGNOSIS — D123 Benign neoplasm of transverse colon: Secondary | ICD-10-CM | POA: Diagnosis not present

## 2019-03-19 DIAGNOSIS — K219 Gastro-esophageal reflux disease without esophagitis: Secondary | ICD-10-CM | POA: Insufficient documentation

## 2019-03-19 DIAGNOSIS — R1314 Dysphagia, pharyngoesophageal phase: Secondary | ICD-10-CM | POA: Diagnosis not present

## 2019-03-19 DIAGNOSIS — D120 Benign neoplasm of cecum: Secondary | ICD-10-CM | POA: Diagnosis not present

## 2019-03-19 HISTORY — PX: POLYPECTOMY: SHX5525

## 2019-03-19 HISTORY — PX: BIOPSY: SHX5522

## 2019-03-19 HISTORY — PX: ESOPHAGOGASTRODUODENOSCOPY: SHX5428

## 2019-03-19 HISTORY — PX: COLONOSCOPY: SHX5424

## 2019-03-19 LAB — GLUCOSE, CAPILLARY: Glucose-Capillary: 227 mg/dL — ABNORMAL HIGH (ref 70–99)

## 2019-03-19 SURGERY — COLONOSCOPY
Anesthesia: Moderate Sedation

## 2019-03-19 MED ORDER — LIDOCAINE VISCOUS HCL 2 % MT SOLN
OROMUCOSAL | Status: AC
  Filled 2019-03-19: qty 15

## 2019-03-19 MED ORDER — LIDOCAINE VISCOUS HCL 2 % MT SOLN
OROMUCOSAL | Status: DC | PRN
  Administered 2019-03-19: 1 via OROMUCOSAL

## 2019-03-19 MED ORDER — MEPERIDINE HCL 50 MG/ML IJ SOLN
INTRAMUSCULAR | Status: AC
  Filled 2019-03-19: qty 1

## 2019-03-19 MED ORDER — MIDAZOLAM HCL 5 MG/5ML IJ SOLN
INTRAMUSCULAR | Status: AC
  Filled 2019-03-19: qty 10

## 2019-03-19 MED ORDER — MIDAZOLAM HCL 5 MG/5ML IJ SOLN
INTRAMUSCULAR | Status: DC | PRN
  Administered 2019-03-19: 2 mg via INTRAVENOUS
  Administered 2019-03-19 (×3): 1 mg via INTRAVENOUS
  Administered 2019-03-19 (×2): 2 mg via INTRAVENOUS
  Administered 2019-03-19: 1 mg via INTRAVENOUS

## 2019-03-19 MED ORDER — MEPERIDINE HCL 50 MG/ML IJ SOLN
INTRAMUSCULAR | Status: DC | PRN
  Administered 2019-03-19 (×2): 25 mg

## 2019-03-19 MED ORDER — LOPERAMIDE HCL 2 MG PO CAPS: 2.0000 mg | ORAL_CAPSULE | Freq: Every day | ORAL | Status: DC

## 2019-03-19 MED ORDER — STERILE WATER FOR IRRIGATION IR SOLN
Status: DC | PRN
  Administered 2019-03-19: 09:00:00 5 mL

## 2019-03-19 MED ORDER — SODIUM CHLORIDE 0.9 % IV SOLN: INTRAVENOUS | Status: DC

## 2019-03-19 NOTE — Discharge Instructions (Signed)
No NSAIDs for 1 week. Resume usual medications as before. Imodium OTC 2 mg by mouth daily with breakfast.   Resume usual diet. Driving for 24 hours. Patient will call with biopsy results and  further recommendations.   Upper Endoscopy, Adult, Care After This sheet gives you information about how to care for yourself after your procedure. Your health care provider may also give you more specific instructions. If you have problems or questions, contact your health care provider. What can I expect after the procedure? After the procedure, it is common to have:  A sore throat.  Mild stomach pain or discomfort.  Bloating.  Nausea. Follow these instructions at home:   Follow instructions from your health care provider about what to eat or drink after your procedure.  Return to your normal activities as told by your health care provider. Ask your health care provider what activities are safe for you.  Take over-the-counter and prescription medicines only as told by your health care provider.  Do not drive for 24 hours if you were given a sedative during your procedure.  Keep all follow-up visits as told by your health care provider. This is important. Contact a health care provider if you have:  A sore throat that lasts longer than one day.  Trouble swallowing. Get help right away if:  You vomit blood or your vomit looks like coffee grounds.  You have: ? A fever. ? Bloody, black, or tarry stools. ? A severe sore throat or you cannot swallow. ? Difficulty breathing. ? Severe pain in your chest or abdomen. Summary  After the procedure, it is common to have a sore throat, mild stomach discomfort, bloating, and nausea.  Do not drive for 24 hours if you were given a sedative during the procedure.  Follow instructions from your health care provider about what to eat or drink after your procedure.  Return to your normal activities as told by your health care provider. This  information is not intended to replace advice given to you by your health care provider. Make sure you discuss any questions you have with your health care provider. Document Revised: 07/22/2017 Document Reviewed: 06/30/2017 Elsevier Patient Education  [place].  Colonoscopy, Adult, Care After This sheet gives you information about how to care for yourself after your procedure. Your doctor may also give you more specific instructions. If you have problems or questions, call your doctor. What can I expect after the procedure? After the procedure, it is common to have:  A small amount of blood in your poop (stool) for 24 hours.  Some gas.  Mild cramping or bloating in your belly (abdomen). Follow these instructions at home: Eating and drinking   Drink enough fluid to keep your pee (urine) pale yellow.  Follow instructions from your doctor about what you cannot eat or drink.  Return to your normal diet as told by your doctor. Avoid heavy or fried foods that are hard to digest. Activity  Rest as told by your doctor.  Do not sit for a long time without moving. Get up to take short walks every 1-2 hours. This is important. Ask for help if you feel weak or unsteady.  Return to your normal activities as told by your doctor. Ask your doctor what activities are safe for you. To help cramping and bloating:   Try walking around.  Put heat on your belly as told by your doctor. Use the heat source that your doctor recommends, such as  a moist heat pack or a heating pad. ? Put a towel between your skin and the heat source. ? Leave the heat on for 20-30 minutes. ? Remove the heat if your skin turns bright red. This is very important if you are unable to feel pain, heat, or cold. You may have a greater risk of getting burned. General instructions  For the first 24 hours after the procedure: ? Do not drive or use machinery. ? Do not sign important documents. ? Do not drink  alcohol. ? Do your daily activities more slowly than normal. ? Eat foods that are soft and easy to digest.  Take over-the-counter or prescription medicines only as told by your doctor.  Keep all follow-up visits as told by your doctor. This is important. Contact a doctor if:  You have blood in your poop 2-3 days after the procedure. Get help right away if:  You have more than a small amount of blood in your poop.  You see large clumps of tissue (blood clots) in your poop.  Your belly is swollen.  You feel like you may vomit (nauseous).  You vomit.  You have a fever.  You have belly pain that gets worse, and medicine does not help your pain. Summary  After the procedure, it is common to have a small amount of blood in your poop. You may also have mild cramping and bloating in your belly.  For the first 24 hours after the procedure, do not drive or use machinery, do not sign important documents, and do not drink alcohol.  Get help right away if you have a lot of blood in your poop, feel like you may vomit, have a fever, or have more belly pain. This information is not intended to replace advice given to you by your health care provider. Make sure you discuss any questions you have with your health care provider. Document Revised: 08/24/2018 Document Reviewed: 08/24/2018 Elsevier Patient Education  [place].  Colon Polyps  Polyps are tissue growths inside the body. Polyps can grow in many places, including the large intestine (colon). A polyp may be a round bump or a mushroom-shaped growth. You could have one polyp or several. Most colon polyps are noncancerous (benign). However, some colon polyps can become cancerous over time. Finding and removing the polyps early can help prevent this. What are the causes? The exact cause of colon polyps is not known. What increases the risk? You are more likely to develop this condition if you:  Have a family history of colon  cancer or colon polyps.  Are older than 74 or older than 45 if you are African American.  Have inflammatory bowel disease, such as ulcerative colitis or Crohn's disease.  Have certain hereditary conditions, such as: ? Familial adenomatous polyposis. ? Lynch syndrome. ? Turcot syndrome. ? Peutz-Jeghers syndrome.  Are overweight.  Smoke cigarettes.  Do not get enough exercise.  Drink too much alcohol.  Eat a diet that is high in fat and red meat and low in fiber.  Had childhood cancer that was treated with abdominal radiation. What are the signs or symptoms? Most polyps do not cause symptoms. If you have symptoms, they may include:  Blood coming from your rectum when having a bowel movement.  Blood in your stool. The stool may look dark red or black.  Abdominal pain.  A change in bowel habits, such as constipation or diarrhea. How is this diagnosed? This condition is diagnosed with a  colonoscopy. This is a procedure in which a lighted, flexible scope is inserted into the anus and then passed into the colon to examine the area. Polyps are sometimes found when a colonoscopy is done as part of routine cancer screening tests. How is this treated? Treatment for this condition involves removing any polyps that are found. Most polyps can be removed during a colonoscopy. Those polyps will then be tested for cancer. Additional treatment may be needed depending on the results of testing. Follow these instructions at home: Lifestyle  Maintain a healthy weight, or lose weight if recommended by your health care provider.  Exercise every day or as told by your health care provider.  Do not use any products that contain nicotine or tobacco, such as cigarettes and e-cigarettes. If you need help quitting, ask your health care provider.  If you drink alcohol, limit how much you have: ? 0-1 drink a day for women. ? 0-2 drinks a day for men.  Be aware of how much alcohol is in your  drink. In the [place] one drink equals one 12 oz bottle of beer (355 mL), one 5 oz glass of wine (148 mL), or one 1 oz shot of hard liquor (44 mL). Eating and drinking   Eat foods that are high in fiber, such as fruits, vegetables, and whole grains.  Eat foods that are high in calcium and vitamin D, such as milk, cheese, yogurt, eggs, liver, fish, and broccoli.  Limit foods that are high in fat, such as fried foods and desserts.  Limit the amount of red meat and processed meat you eat, such as hot dogs, sausage, bacon, and lunch meats. General instructions  Keep all follow-up visits as told by your health care provider. This is important. ? This includes having regularly scheduled colonoscopies. ? Talk to your health care provider about when you need a colonoscopy. Contact a health care provider if:  You have new or worsening bleeding during a bowel movement.  You have new or increased blood in your stool.  You have a change in bowel habits.  You lose weight for no known reason. Summary  Polyps are tissue growths inside the body. Polyps can grow in many places, including the colon.  Most colon polyps are noncancerous (benign), but some can become cancerous over time.  This condition is diagnosed with a colonoscopy.  Treatment for this condition involves removing any polyps that are found. Most polyps can be removed during a colonoscopy. This information is not intended to replace advice given to you by your health care provider. Make sure you discuss any questions you have with your health care provider. Document Revised: 05/15/2017 Document Reviewed: 05/15/2017 Elsevier Patient Education  [place].

## 2019-03-19 NOTE — Op Note (Signed)
[facility] Patient Name: Alec Larson Procedure Date: 03/19/2019 7:04 AM MRN: [id number] Date of Birth: Feb 18, 1945 Attending MD: Hildred Laser , MD CSN: [id number] Age: 74 Admit Type: Outpatient Procedure:                Colonoscopy Indications:              Chronic diarrhea Providers:                Hildred Laser, MD, Janeece Riggers, RN, Aram Candela Referring MD:              Medicines:                Midazolam 3 mg IV Complications:            No immediate complications. Estimated Blood Loss:     Estimated blood loss was minimal. Procedure:                Pre-Anesthesia Assessment:                           - Prior to the procedure, a History and Physical                            was performed, and patient medications and                            allergies were reviewed. The patient's tolerance of                            previous anesthesia was also reviewed. The risks                            and benefits of the procedure and the sedation                            options and risks were discussed with the patient.                            All questions were answered, and informed consent                            was obtained. Prior Anticoagulants: The patient has                            taken no previous anticoagulant or antiplatelet                            agents. ASA Grade Assessment: III - A patient with                            severe systemic disease. After reviewing the risks                            and benefits, the patient was deemed in  satisfactory condition to undergo the procedure.                           After obtaining informed consent, the colonoscope                            was passed under direct vision. Throughout the                            procedure, the patient's blood pressure, pulse, and                            oxygen saturations were monitored continuously. The   [id number] was introduced through the                            anus and advanced to the the cecum, identified by                            appendiceal orifice and ileocecal valve. The                            colonoscopy was performed without difficulty. The                            patient tolerated the procedure well. The quality                            of the bowel preparation was excellent. Scope In: 8:01:54 AM Scope Out: 8:59:32 AM Scope Withdrawal Time: 0 hours 49 minutes 16 seconds  Total Procedure Duration: 0 hours 57 minutes 38 seconds  Findings:      The perianal and digital rectal examinations were normal.      A 6 mm polyp was found in the cecum. The polyp was sessile. The polyp       was removed with a cold snare. Resection and retrieval were complete. To       stop active bleeding, two hemostatic clips were successfully placed (MR       conditional). There was no bleeding at the end of the procedure. The       pathology specimen was placed into Bottle Number 1.      Two polyps were found in the hepatic flexure. The polyps were 7 to 10 mm       in size. These polyps were removed with a hot snare. Resection and       retrieval were complete. The pathology specimen was placed into Bottle       Number 1.      A 8 to 12 mm polyp was found in the proximal transverse colon. The polyp       was sessile. Area was unsuccessfully injected with 5 mL Eleview for       lesion assessment, but the lesion could not be lifted adequately. The       polyp was removed with a piecemeal technique using a hot snare. Polyp       resection was incomplete. The resected tissue was retrieved. Coagulation       for  destruction of remaining portion of lesion using monopolar probe was       successful. The pathology specimen was placed into Bottle Number 3.      A 12 mm polyp was found in the proximal sigmoid colon. The polyp was       multi-lobulated. The polyp was removed with a hot  snare. Resection and       retrieval were complete. The pathology specimen was placed into Bottle       Number 4. Biopsies were taken with a cold forceps for histology.       Biopsies for histology were taken with a cold forceps from the right       colon and sigmoid colon for evaluation of microscopic colitis. The       pathology specimen was placed into Bottle Number 2.      The retroflexed view of the distal rectum and anal verge was normal and       showed no anal or rectal abnormalities. Impression:               - One 6 mm polyp in the cecum, removed with a cold                            snare. Resected and retrieved. Clips (MR                            conditional) were placed.                           - Two 7 to 10 mm polyps at the hepatic flexure,                            removed with a hot snare. Resected and retrieved.                           - One 8 to 12 mm polyp in the proximal transverse                            colon, removed piecemeal using a hot snare.                            Incomplete resection. Resected tissue retrieved.                            Remaining polyp was coagulated with monopolar                            cautery.                           - One 12 mm polyp in the proximal sigmoid colon,                            removed with a hot snare. Resected and retrieved.                            Biopsied. Moderate  Sedation:      Moderate (conscious) sedation was administered by the endoscopy nurse       and supervised by the endoscopist. The following parameters were       monitored: oxygen saturation, heart rate, blood pressure, CO2       capnography and response to care. Total physician intraservice time was       45 minutes. Recommendation:           - Patient has a contact number available for                            emergencies. The signs and symptoms of potential                            delayed complications were discussed with the                             patient. Return to normal activities tomorrow.                            Written discharge instructions were provided to the                            patient.                           - Resume previous diet today.                           - Continue present medications.                           - No aspirin, ibuprofen, naproxen, or other                            non-steroidal anti-inflammatory drugs for 7 days.                           - Await pathology results.                           - Repeat colonoscopy in 1 year for surveillance. Procedure Code(s):        --- Professional ---                           [phone], Colonoscopy, flexible; with removal of                            tumor(s), polyp(s), or other lesion(s) by snare                            technique                           [id number], Colonoscopy, flexible; with directed  submucosal injection(s), any substance                           99153, Moderate sedation; each additional 15                            minutes intraservice time                           99153, Moderate sedation; each additional 15                            minutes intraservice time                           G0500, Moderate sedation services provided by the                            same physician or other qualified health care                            professional performing a gastrointestinal                            endoscopic service that sedation supports,                            requiring the presence of an independent trained                            observer to assist in the monitoring of the                            patient's level of consciousness and physiological                            status; initial 15 minutes of intra-service time;                            patient age 16 years or older (additional time may                            be reported with [id number], as  appropriate) Diagnosis Code(s):        --- Professional ---                           K63.5, Polyp of colon                           K52.9, Noninfective gastroenteritis and colitis,                            unspecified CPT copyright 2019 American Medical Association. All rights reserved. The codes documented in this report are preliminary and upon coder review may  be revised to meet current compliance requirements. Hildred Laser, MD Hildred Laser,  MD 03/19/2019 9:19:35 AM This report has been signed electronically. Number of Addenda: 0

## 2019-03-19 NOTE — H&P (Signed)
Alec Larson is an 74 y.o. male.   Chief Complaint: Patient is here for esophagogastroduodenoscopy with esophageal dilation and colonoscopy. HPI: Patient is 74 year old Caucasian male with multiple medical problems who has chronic GERD has been experience dysphagia since he had cervical spine surgery 2011.  He has had few episodes of food impaction along the way.  He points to suprasternal area on the right side as a site for bolus obstruction.  He feels heartburn is well controlled with therapy.  He has good appetite and has not lost any weight.  He also has been explained diarrhea for at least a year.  On worst days he has 8 or 9 stools.  He has chronic low back pain and uses pain medication may be 3 times a week.  He noticed significant improvement in his diarrhea when he takes pain medication.  No history of melena or rectal bleeding or abdominal pain. GI pathogen panel and C. difficile stool testing were negative. Family history is negative for CRC.  Past Medical History:  Diagnosis Date  . Anxiety   . BPH (benign prostatic hyperplasia)   . Diabetes mellitus without complication ([place])   . GERD (gastroesophageal reflux disease)   . H/O renal calculi   . Hypercholesteremia   . Hypertension     Past Surgical History:  Procedure Laterality Date  . CERVICAL SPINE SURGERY    . CYSTOSCOPY WITH INSERTION OF UROLIFT N/A 01/20/2019   Procedure: CYSTOSCOPY WITH INSERTION OF UROLIFT;  Surgeon: Cleon Gustin, MD;  Location: [facility];  Service: Urology;  Laterality: N/A;  30 MINS  . TONSILLECTOMY     age 49    History reviewed. No pertinent family history. Social History:  reports that he has quit smoking. He has never used smokeless tobacco. He reports previous alcohol use. He reports that he does not use drugs.  Allergies:  Allergies  Allergen Reactions  . Aspirin Anaphylaxis and Hives  . Penicillins Hives    Did it involve swelling of the face/tongue/throat, SOB, or low BP? No Did it  involve sudden or severe rash/hives, skin peeling, or any reaction on the inside of your mouth or nose? No Did you need to seek medical attention at a hospital or doctor's office?Never had a reaction When did it last happen? Never had a reaction to this medication. If all above answers are "NO", may proceed with cephalosporin use.   . Bee Venom Hives    Medications Prior to Admission  Medication Sig Dispense Refill  . amLODipine (NORVASC) 10 MG tablet Take 10 mg by mouth daily.    . Ascorbic Acid (VITAMIN C PO) Take 1 tablet by mouth daily.    Marland Kitchen atorvastatin (LIPITOR) 40 MG tablet Take 40 mg by mouth daily.    Marland Kitchen azelastine (OPTIVAR) 0.05 % ophthalmic solution Place 1 drop into both eyes 2 (two) times daily as needed (itching/swelling eyes. (allergies)).     Marland Kitchen benazepril (LOTENSIN) 40 MG tablet Take 40 mg by mouth daily in the afternoon.     . cyanocobalamin 1000 MCG tablet Take 1,000 mcg by mouth daily.     . DULoxetine (CYMBALTA) 60 MG capsule Take 60 mg by mouth daily.    . fluticasone (FLONASE) 50 MCG/ACT nasal spray Place 2 sprays into both nostrils daily as needed (allergies.).     Marland Kitchen gabapentin (NEURONTIN) 300 MG capsule Take 300-600 mg by mouth See admin instructions. Take 2 capsules (600 mg) in the morning, 1 capsule (300 mg) by mouth  at noon, & 1 capsule (300 mg) by mouth at night    . glimepiride (AMARYL) 4 MG tablet Take 4 mg by mouth daily with breakfast.    . hydrochlorothiazide (HYDRODIURIL) 25 MG tablet Take 25 mg by mouth daily.    . metFORMIN (GLUCOPHAGE) 1000 MG tablet Take 1,000 mg by mouth 2 (two) times daily.     . metoprolol tartrate (LOPRESSOR) 50 MG tablet Take 50 mg by mouth 2 (two) times daily with a meal.    . Multiple Vitamins-Minerals (ZINC PO) Take 1 tablet by mouth daily.    Marland Kitchen NOVOLIN 70/30 (70-30) 100 UNIT/ML injection Inject 40-60 Units into the skin See admin instructions. Inject 40 units into the skin in the morning and 60 units at night    . omeprazole  (PRILOSEC) 20 MG capsule Take 20 mg by mouth daily before breakfast.     . oxyCODONE-acetaminophen (PERCOCET) 10-325 MG tablet Take 1 tablet by mouth every 6 (six) hours as needed for pain. 15 tablet 0  . dicyclomine (BENTYL) 10 MG capsule Take 1 capsule (10 mg total) by mouth 3 (three) times daily as needed (abd pain, diarrhea). (Patient not taking: Reported on 03/10/2019) 30 capsule 0  . TRUEPLUS INSULIN SYRINGE 30G X 5/16" 1 ML MISC       Results for orders placed or performed during the hospital encounter of 03/19/19 (from the past 48 hour(s))  Glucose, capillary     Status: Abnormal   Collection Time: 03/19/19  6:56 AM  Result Value Ref Range   Glucose-Capillary 227 (H) 70 - 99 mg/dL   No results found.  Review of Systems  Blood pressure (!) 187/80, pulse (!) 105, temperature 98.2 F (36.8 C), temperature source Oral, resp. rate 11, height 5\' 3"  (1.6 m), weight 97.1 kg, SpO2 98 %. Physical Exam  Constitutional: He appears well-developed and well-nourished.  HENT:  Mouth/Throat: Oropharynx is clear and moist.  Patient is edentulous.  Eyes: Conjunctivae are normal. No scleral icterus.  Neck: No thyromegaly present.  Cardiovascular: Normal rate and regular rhythm.  Murmur heard. Grade 2/6 systolic murmur best heard at left sternal border.  Respiratory: Effort normal and breath sounds normal.  GI:  Abdomen is full but soft and nontender with organomegaly or masses.  Musculoskeletal:        General: Edema present.     Comments: Trace edema around both ankles.  Lymphadenopathy:    He has no cervical adenopathy.  Neurological: He is alert.  Skin: Skin is warm and dry.     Assessment/Plan Esophageal dysphagia in patient with chronic GERD. Chronic diarrhea with negative stool studies. Esophagogastroduodenoscopy with esophageal dilation and diagnostic colonoscopy.   Hildred Laser, MD 03/19/2019, 7:36 AM

## 2019-03-19 NOTE — Op Note (Signed)
[facility] Patient Name: Alec Larson Procedure Date: 03/19/2019 7:14 AM MRN: [id number] Date of Birth: 1946-01-02 Attending MD: Hildred Laser , MD CSN: [id number] Age: 74 Admit Type: Outpatient Procedure:                Upper GI endoscopy Indications:              Esophageal dysphagia, Gastro-esophageal reflux                            disease Providers:                Hildred Laser, MD, Janeece Riggers, RN, Aram Candela Referring MD:              Medicines:                Lidocaine spray, Meperidine 50 mg IV, Midazolam 7                            mg IV Complications:            No immediate complications. Estimated Blood Loss:     Estimated blood loss: none. Procedure:                Pre-Anesthesia Assessment:                           - Prior to the procedure, a History and Physical                            was performed, and patient medications and                            allergies were reviewed. The patient's tolerance of                            previous anesthesia was also reviewed. The risks                            and benefits of the procedure and the sedation                            options and risks were discussed with the patient.                            All questions were answered, and informed consent                            was obtained. Prior Anticoagulants: The patient has                            taken no previous anticoagulant or antiplatelet                            agents. ASA Grade Assessment: III - A patient with  severe systemic disease. After reviewing the risks                            and benefits, the patient was deemed in                            satisfactory condition to undergo the procedure.                           After obtaining informed consent, the endoscope was                            passed under direct vision. Throughout the                            procedure, the patient's blood  pressure, pulse, and                            oxygen saturations were monitored continuously. The                            [id number] scope was introduced through the                            mouth, and advanced to the second part of duodenum.                            The upper GI endoscopy was accomplished without                            difficulty. The patient tolerated the procedure                            well. Scope In: 7:49:35 AM Scope Out: 7:57:20 AM Total Procedure Duration: 0 hours 7 minutes 45 seconds  Findings:      The examined esophagus was normal.      The Z-line was irregular and was found 40 cm from the incisors.      No endoscopic abnormality was evident in the esophagus to explain the       patient's complaint of dysphagia. It was decided, however, to proceed       with dilation of the entire esophagus. The scope was withdrawn. Dilation       was performed with a Maloney dilator with no resistance at 85 Fr. The       dilation site was examined following endoscope reinsertion and showed no       change and no bleeding, mucosal tear or perforation.      The entire examined stomach was normal.      The duodenal bulb and second portion of the duodenum were normal. Impression:               - Normal esophagus.                           - Z-line irregular, 40 cm from the incisors.                           -  No endoscopic esophageal abnormality to explain                            patient's dysphagia. Esophagus dilated. Dilated.                           - Normal stomach.                           - Normal duodenal bulb and second portion of the                            duodenum.                           - No specimens collected. Moderate Sedation:      Moderate (conscious) sedation was administered by the endoscopy nurse       and supervised by the endoscopist. The following parameters were       monitored: oxygen saturation, heart rate, blood  pressure, CO2       capnography and response to care. Total physician intraservice time was       15 minutes. Recommendation:           - Patient has a contact number available for                            emergencies. The signs and symptoms of potential                            delayed complications were discussed with the                            patient. Return to normal activities tomorrow.                            Written discharge instructions were provided to the                            patient.                           - Resume previous diet today.                           - Continue present medications.                           - See the other procedure note for documentation of                            additional recommendations. Procedure Code(s):        --- Professional ---                           [phone], Esophagogastroduodenoscopy, flexible,  transoral; diagnostic, including collection of                            specimen(s) by brushing or washing, when performed                            (separate procedure)                           43450, Dilation of esophagus, by unguided sound or                            bougie, single or multiple passes                           G0500, Moderate sedation services provided by the                            same physician or other qualified health care                            professional performing a gastrointestinal                            endoscopic service that sedation supports,                            requiring the presence of an independent trained                            observer to assist in the monitoring of the                            patient's level of consciousness and physiological                            status; initial 15 minutes of intra-service time;                            patient age 12 years or older (additional time may                            be  reported with [id number], as appropriate) Diagnosis Code(s):        --- Professional ---                           K22.8, Other specified diseases of esophagus                           R13.14, Dysphagia, pharyngoesophageal phase                           K21.9, Gastro-esophageal reflux disease without  esophagitis CPT copyright 2019 [employer]. All rights reserved. The codes documented in this report are preliminary and upon coder review may  be revised to meet current compliance requirements. Hildred Laser, MD Hildred Laser, MD 03/19/2019 9:10:23 AM This report has been signed electronically. Number of Addenda: 0

## 2019-03-22 LAB — SURGICAL PATHOLOGY

## 2019-04-21 ENCOUNTER — Other Ambulatory Visit: Payer: Self-pay | Admitting: Urology

## 2019-08-04 ENCOUNTER — Ambulatory Visit (INDEPENDENT_AMBULATORY_CARE_PROVIDER_SITE_OTHER): Payer: Medicare PPO | Admitting: Urology

## 2019-08-04 ENCOUNTER — Encounter: Payer: Self-pay | Admitting: Urology

## 2019-08-04 ENCOUNTER — Other Ambulatory Visit: Payer: Self-pay

## 2019-08-04 VITALS — BP 199/76 | HR 74 | Temp 98.7°F | Ht 63.0 in | Wt 222.0 lb

## 2019-08-04 DIAGNOSIS — N138 Other obstructive and reflux uropathy: Secondary | ICD-10-CM

## 2019-08-04 DIAGNOSIS — N401 Enlarged prostate with lower urinary tract symptoms: Secondary | ICD-10-CM

## 2019-08-04 DIAGNOSIS — R339 Retention of urine, unspecified: Secondary | ICD-10-CM | POA: Diagnosis not present

## 2019-08-04 LAB — BLADDER SCAN AMB NON-IMAGING: Scan Result: 57.6

## 2019-08-04 NOTE — Progress Notes (Signed)
08/04/2019 9:37 AM   Alec Larson [birth year] [id number]  Referring provider: No referring provider defined for this encounter.  Incomplete emptying  HPI: Alec Larson is a 74yo here for followup for BPH and incomplete emptying. Since last visit his nocturia improved to 1-2x. His DMII has become worse and he has increased his fluid intake due to thirst. Blood sugars 200-300 range. Stream is strong. NO dysuria or hematuria. PVR 57cc.   Previous office visit hx: Alec Larson is a 74yo with a hx of BPH with severe LUTS. S/p Urolift 2 weeks. His nocturia worsened to 3x and now is 1x. Urgency and frequency resolve. No hematuria. No dysuria. No pelvic pain. No exacerbating/alleviaitng events. No other associated symptoms. IPSS 10   PMH: Past Medical History:  Diagnosis Date  . Anxiety   . BPH (benign prostatic hyperplasia)   . Diabetes mellitus without complication ([place])   . GERD (gastroesophageal reflux disease)   . H/O renal calculi   . Hypercholesteremia   . Hypertension     Surgical History: Past Surgical History:  Procedure Laterality Date  . BIOPSY  03/19/2019   Procedure: BIOPSY;  Surgeon: Rogene Houston, MD;  Location: [facility];  Service: Endoscopy;;  . CERVICAL SPINE SURGERY    . COLONOSCOPY N/A 03/19/2019   Procedure: COLONOSCOPY;  Surgeon: Rogene Houston, MD;  Location: [facility];  Service: Endoscopy;  Laterality: N/A;  730  . CYSTOSCOPY WITH INSERTION OF UROLIFT N/A 01/20/2019   Procedure: CYSTOSCOPY WITH INSERTION OF UROLIFT;  Surgeon: Cleon Gustin, MD;  Location: [facility];  Service: Urology;  Laterality: N/A;  30 MINS  . ESOPHAGOGASTRODUODENOSCOPY N/A 03/19/2019   Procedure: ESOPHAGOGASTRODUODENOSCOPY (EGD);  Surgeon: Rogene Houston, MD;  Location: [facility];  Service: Endoscopy;  Laterality: N/A;  730  . POLYPECTOMY  03/19/2019   Procedure: POLYPECTOMY;  Surgeon: Rogene Houston, MD;  Location: [facility];  Service: Endoscopy;;  . TONSILLECTOMY     age  73    Home Medications:  Allergies as of 08/04/2019      Reactions   Aspirin Anaphylaxis, Hives   Penicillins Hives   Did it involve swelling of the face/tongue/throat, SOB, or low BP? No Did it involve sudden or severe rash/hives, skin peeling, or any reaction on the inside of your mouth or nose? No Did you need to seek medical attention at a hospital or doctor's office?Never had a reaction When did it last happen? Never had a reaction to this medication. If all above answers are "NO", may proceed with cephalosporin use.   Bee Venom Hives      Medication List       Accurate as of August 04, 2019  9:37 AM. If you have any questions, ask your nurse or doctor.        STOP taking these medications   metFORMIN 1000 MG tablet Commonly known as: GLUCOPHAGE Stopped by: Nicolette Bang, MD     TAKE these medications   amLODipine 10 MG tablet Commonly known as: NORVASC Take 10 mg by mouth daily.   atorvastatin 40 MG tablet Commonly known as: LIPITOR Take 40 mg by mouth daily. What changed: Another medication with the same name was removed. Continue taking this medication, and follow the directions you see here. Changed by: Nicolette Bang, MD   azelastine 0.05 % ophthalmic solution Commonly known as: OPTIVAR Place 1 drop into both eyes 2 (two) times daily as needed (itching/swelling eyes. (allergies)).   benazepril 40  MG tablet Commonly known as: LOTENSIN Take 40 mg by mouth daily in the afternoon.   cyanocobalamin 1000 MCG tablet Take 1,000 mcg by mouth daily.   dicyclomine 10 MG capsule Commonly known as: BENTYL Take 1 capsule by mouth 3 (three) times daily as needed.   DULoxetine 60 MG capsule Commonly known as: CYMBALTA Take 60 mg by mouth daily.   fluticasone 50 MCG/ACT nasal spray Commonly known as: FLONASE Place 2 sprays into both nostrils daily as needed (allergies.).   gabapentin 300 MG capsule Commonly known as: NEURONTIN Take 300-600 mg by mouth See  admin instructions. Take 2 capsules (600 mg) in the morning, 1 capsule (300 mg) by mouth at noon, & 1 capsule (300 mg) by mouth at night   glimepiride 4 MG tablet Commonly known as: AMARYL Take 4 mg by mouth daily with breakfast. What changed: Another medication with the same name was removed. Continue taking this medication, and follow the directions you see here. Changed by: Nicolette Bang, MD   hydrochlorothiazide 25 MG tablet Commonly known as: HYDRODIURIL Take 25 mg by mouth daily.   loperamide 2 MG capsule Commonly known as: IMODIUM Take 1 capsule (2 mg total) by mouth daily with breakfast.   meloxicam 15 MG tablet Commonly known as: MOBIC Take by mouth.   metoprolol tartrate 50 MG tablet Commonly known as: LOPRESSOR Take 50 mg by mouth 2 (two) times daily with a meal.   NovoLIN 70/30 (70-30) 100 UNIT/ML injection Generic drug: insulin NPH-regular Human Inject 40-60 Units into the skin See admin instructions. Inject 40 units into the skin in the morning and 60 units at night   omeprazole 20 MG capsule Commonly known as: PRILOSEC Take 20 mg by mouth daily before breakfast. What changed: Another medication with the same name was removed. Continue taking this medication, and follow the directions you see here. Changed by: Nicolette Bang, MD   oxyCODONE-acetaminophen 10-325 MG tablet Commonly known as: PERCOCET Take 1 tablet by mouth every 6 (six) hours as needed for pain. What changed: Another medication with the same name was removed. Continue taking this medication, and follow the directions you see here. Changed by: Nicolette Bang, MD   sitaGLIPtin 50 MG tablet Commonly known as: JANUVIA Take by mouth.   TRUEplus Insulin Syringe 30G X 5/16" 1 ML Misc Generic drug: Insulin Syringe-Needle U-100   VITAMIN C PO Take 1 tablet by mouth daily.   ZINC PO Take 1 tablet by mouth daily.       Allergies:  Allergies  Allergen Reactions  . Aspirin Anaphylaxis  and Hives  . Penicillins Hives    Did it involve swelling of the face/tongue/throat, SOB, or low BP? No Did it involve sudden or severe rash/hives, skin peeling, or any reaction on the inside of your mouth or nose? No Did you need to seek medical attention at a hospital or doctor's office?Never had a reaction When did it last happen? Never had a reaction to this medication. If all above answers are "NO", may proceed with cephalosporin use.   Raelyn Ensign Venom Hives    Family History: No family history on file.  Social History:  reports that he has quit smoking. He has never used smokeless tobacco. He reports previous alcohol use. He reports that he does not use drugs.  ROS: All other review of systems were reviewed and are negative except what is noted above in HPI  Physical Exam: BP (!) 199/76   Pulse 74   Temp  98.7 F (37.1 C)   Ht 5\' 3"  (1.6 m)   Wt 222 lb (100.7 kg)   BMI 39.33 kg/m   Constitutional:  Alert and oriented, No acute distress. HEENT: [place] AT, moist mucus membranes.  Trachea midline, no masses. Cardiovascular: No clubbing, cyanosis, or edema. Respiratory: Normal respiratory effort, no increased work of breathing. GI: Abdomen is soft, nontender, nondistended, no abdominal masses GU: No CVA tenderness.  Lymph: No cervical or inguinal lymphadenopathy. Skin: No rashes, bruises or suspicious lesions. Neurologic: Grossly intact, no focal deficits, moving all 4 extremities. Psychiatric: Normal mood and affect.  Laboratory Data: Lab Results  Component Value Date   WBC 11.0 (H) 01/15/2019   HGB 13.7 01/15/2019   HCT 42.5 01/15/2019   MCV 89.3 01/15/2019   PLT 295 01/15/2019    Lab Results  Component Value Date   CREATININE 1.25 (H) 01/15/2019    No results found for: PSA  No results found for: TESTOSTERONE  Lab Results  Component Value Date   HGBA1C 8.8 (H) 01/15/2019    Urinalysis No results found for: COLORURINE, APPEARANCEUR, LABSPEC, PHURINE,  GLUCOSEU, HGBUR, BILIRUBINUR, KETONESUR, PROTEINUR, UROBILINOGEN, NITRITE, LEUKOCYTESUR  No results found for: LABMICR, [place], RBCUA, LABEPIT, MUCUS, BACTERIA  Pertinent Imaging:  No results found for this or any previous visit.  No results found for this or any previous visit.  No results found for this or any previous visit.  No results found for this or any previous visit.  No results found for this or any previous visit.  No results found for this or any previous visit.  No results found for this or any previous visit.  No results found for this or any previous visit.   Assessment & Plan:    1. Incomplete emptying of bladder -resolved - BLADDER SCAN AMB NON-IMAGING  2. Benign prostatic hyperplasia with urinary obstruction -We discussed the management of urinary frequency and we discussed improving glucose control. We will not pursue further medical therapy at this time since his worsening LUTS are likely the result of poor glucose control.    No follow-ups on file.  Nicolette Bang, MD  [facility]

## 2019-08-04 NOTE — Patient Instructions (Signed)

## 2019-08-04 NOTE — Progress Notes (Signed)
Urological Symptom Review  Patient is experiencing the following symptoms: Frequent urination ( due to increase water intake)   Review of Systems  Gastrointestinal (upper)  : Negative for upper GI symptoms  Gastrointestinal (lower) : Diarrhea Constipation  Constitutional : Negative for symptoms  Skin: Negative for skin symptoms  Eyes: Negative for eye symptoms  Ear/Nose/Throat : Negative for Ear/Nose/Throat symptoms  Hematologic/Lymphatic: Negative for Hematologic/Lymphatic symptoms  Cardiovascular : Negative for cardiovascular symptoms  Respiratory : Negative for respiratory symptoms  Endocrine: Negative for endocrine symptoms  Musculoskeletal: Negative for musculoskeletal symptoms  Neurological: Negative for neurological symptoms  Psychologic: Negative for psychiatric symptoms

## 2019-10-25 ENCOUNTER — Other Ambulatory Visit: Payer: Self-pay

## 2019-10-25 ENCOUNTER — Other Ambulatory Visit: Payer: Medicare PPO

## 2019-10-25 DIAGNOSIS — R3989 Other symptoms and signs involving the genitourinary system: Secondary | ICD-10-CM

## 2019-10-25 LAB — URINALYSIS, ROUTINE W REFLEX MICROSCOPIC
Bilirubin, UA: NEGATIVE
Glucose, UA: NEGATIVE
Ketones, UA: NEGATIVE
Nitrite, UA: NEGATIVE
Specific Gravity, UA: 1.025 (ref 1.005–1.030)
Urobilinogen, Ur: 1 mg/dL (ref 0.2–1.0)
pH, UA: 5.5 (ref 5.0–7.5)

## 2019-10-25 LAB — MICROSCOPIC EXAMINATION
Renal Epithel, UA: NONE SEEN /hpf
WBC, UA: >30 /hpf — AB (ref 0–5)

## 2019-10-26 ENCOUNTER — Encounter (HOSPITAL_COMMUNITY): Payer: Self-pay

## 2019-10-26 ENCOUNTER — Other Ambulatory Visit: Payer: Self-pay

## 2019-10-26 ENCOUNTER — Telehealth: Payer: Self-pay

## 2019-10-26 ENCOUNTER — Emergency Department (HOSPITAL_COMMUNITY): Payer: Medicare PPO

## 2019-10-26 ENCOUNTER — Inpatient Hospital Stay (HOSPITAL_COMMUNITY)
Admission: EM | Admit: 2019-10-26 | Discharge: 2019-10-29 | DRG: 871 | Disposition: A | Discharge status: Home Health | Payer: Medicare PPO | Attending: Internal Medicine | Admitting: Internal Medicine

## 2019-10-26 DIAGNOSIS — N401 Enlarged prostate with lower urinary tract symptoms: Secondary | ICD-10-CM | POA: Diagnosis present

## 2019-10-26 DIAGNOSIS — B961 Klebsiella pneumoniae [K. pneumoniae] as the cause of diseases classified elsewhere: Secondary | ICD-10-CM | POA: Diagnosis present

## 2019-10-26 DIAGNOSIS — N3000 Acute cystitis without hematuria: Secondary | ICD-10-CM | POA: Diagnosis present

## 2019-10-26 DIAGNOSIS — I129 Hypertensive chronic kidney disease with stage 1 through stage 4 chronic kidney disease, or unspecified chronic kidney disease: Secondary | ICD-10-CM | POA: Diagnosis present

## 2019-10-26 DIAGNOSIS — N1831 Chronic kidney disease, stage 3a: Secondary | ICD-10-CM | POA: Diagnosis present

## 2019-10-26 DIAGNOSIS — A419 Sepsis, unspecified organism: Principal | ICD-10-CM

## 2019-10-26 DIAGNOSIS — R197 Diarrhea, unspecified: Secondary | ICD-10-CM | POA: Diagnosis present

## 2019-10-26 DIAGNOSIS — Z20822 Contact with and (suspected) exposure to covid-19: Secondary | ICD-10-CM | POA: Diagnosis present

## 2019-10-26 DIAGNOSIS — Z880 Allergy status to penicillin: Secondary | ICD-10-CM

## 2019-10-26 DIAGNOSIS — S7000XA Contusion of unspecified hip, initial encounter: Secondary | ICD-10-CM | POA: Diagnosis present

## 2019-10-26 DIAGNOSIS — Z79899 Other long term (current) drug therapy: Secondary | ICD-10-CM

## 2019-10-26 DIAGNOSIS — N390 Urinary tract infection, site not specified: Secondary | ICD-10-CM | POA: Diagnosis present

## 2019-10-26 DIAGNOSIS — F419 Anxiety disorder, unspecified: Secondary | ICD-10-CM | POA: Diagnosis present

## 2019-10-26 DIAGNOSIS — Z87891 Personal history of nicotine dependence: Secondary | ICD-10-CM | POA: Diagnosis not present

## 2019-10-26 DIAGNOSIS — G9341 Metabolic encephalopathy: Secondary | ICD-10-CM | POA: Diagnosis present

## 2019-10-26 DIAGNOSIS — K219 Gastro-esophageal reflux disease without esophagitis: Secondary | ICD-10-CM | POA: Diagnosis present

## 2019-10-26 DIAGNOSIS — Z886 Allergy status to analgesic agent status: Secondary | ICD-10-CM

## 2019-10-26 DIAGNOSIS — W19XXXA Unspecified fall, initial encounter: Secondary | ICD-10-CM | POA: Diagnosis present

## 2019-10-26 DIAGNOSIS — E1140 Type 2 diabetes mellitus with diabetic neuropathy, unspecified: Secondary | ICD-10-CM | POA: Diagnosis present

## 2019-10-26 DIAGNOSIS — B9689 Other specified bacterial agents as the cause of diseases classified elsewhere: Secondary | ICD-10-CM | POA: Diagnosis present

## 2019-10-26 DIAGNOSIS — E876 Hypokalemia: Secondary | ICD-10-CM | POA: Diagnosis present

## 2019-10-26 DIAGNOSIS — E119 Type 2 diabetes mellitus without complications: Secondary | ICD-10-CM

## 2019-10-26 DIAGNOSIS — Z794 Long term (current) use of insulin: Secondary | ICD-10-CM | POA: Diagnosis not present

## 2019-10-26 DIAGNOSIS — E1122 Type 2 diabetes mellitus with diabetic chronic kidney disease: Secondary | ICD-10-CM | POA: Diagnosis present

## 2019-10-26 DIAGNOSIS — I10 Essential (primary) hypertension: Secondary | ICD-10-CM | POA: Diagnosis present

## 2019-10-26 DIAGNOSIS — Z91030 Bee allergy status: Secondary | ICD-10-CM

## 2019-10-26 DIAGNOSIS — Z87442 Personal history of urinary calculi: Secondary | ICD-10-CM

## 2019-10-26 LAB — CBC WITH DIFFERENTIAL/PLATELET
Basophils Absolute: 0 10*3/uL (ref 0.0–0.1)
Basophils Relative: 0 %
Eosinophils Absolute: 0 10*3/uL (ref 0.0–0.5)
Eosinophils Relative: 0 %
HCT: 40.9 % (ref 39.0–52.0)
Hemoglobin: 13.2 g/dL (ref 13.0–17.0)
Lymphocytes Relative: 9 %
Lymphs Abs: 1.8 10*3/uL (ref 0.7–4.0)
MCH: 28.7 pg (ref 26.0–34.0)
MCHC: 32.3 g/dL (ref 30.0–36.0)
MCV: 88.9 fL (ref 80.0–100.0)
Monocytes Absolute: 2.3 10*3/uL — ABNORMAL HIGH (ref 0.1–1.0)
Monocytes Relative: 11 %
Neutro Abs: 16.4 10*3/uL — ABNORMAL HIGH (ref 1.7–7.7)
Neutrophils Relative %: 80 %
Platelets: 214 10*3/uL (ref 150–400)
RBC: 4.6 MIL/uL (ref 4.22–5.81)
RDW: 15.1 % (ref 11.5–15.5)
WBC: 20.5 10*3/uL — ABNORMAL HIGH (ref 4.0–10.5)
nRBC: 0 % (ref 0.0–0.2)

## 2019-10-26 LAB — COMPREHENSIVE METABOLIC PANEL
ALT: 22 U/L (ref 0–44)
AST: 26 U/L (ref 15–41)
Albumin: 3.9 g/dL (ref 3.5–5.0)
Alkaline Phosphatase: 52 U/L (ref 38–126)
Anion gap: 12 (ref 5–15)
BUN: 24 mg/dL — ABNORMAL HIGH (ref 8–23)
CO2: 27 mmol/L (ref 22–32)
Calcium: 8.9 mg/dL (ref 8.9–10.3)
Chloride: 98 mmol/L (ref 98–111)
Creatinine, Ser: 1.45 mg/dL — ABNORMAL HIGH (ref 0.61–1.24)
GFR calc Af Amer: 54 mL/min — ABNORMAL LOW (ref >60)
GFR calc non Af Amer: 47 mL/min — ABNORMAL LOW (ref >60)
Glucose, Bld: 212 mg/dL — ABNORMAL HIGH (ref 70–99)
Potassium: 3.2 mmol/L — ABNORMAL LOW (ref 3.5–5.1)
Sodium: 137 mmol/L (ref 135–145)
Total Bilirubin: 1.4 mg/dL — ABNORMAL HIGH (ref 0.3–1.2)
Total Protein: 7.5 g/dL (ref 6.5–8.1)

## 2019-10-26 LAB — MAGNESIUM: Magnesium: 1.9 mg/dL (ref 1.7–2.4)

## 2019-10-26 LAB — SARS CORONAVIRUS 2 BY RT PCR (HOSPITAL ORDER, PERFORMED IN ~~LOC~~ HOSPITAL LAB): SARS Coronavirus 2: NEGATIVE

## 2019-10-26 LAB — URINALYSIS, ROUTINE W REFLEX MICROSCOPIC
Bilirubin Urine: NEGATIVE
Glucose, UA: 50 mg/dL — AB
Ketones, ur: 5 mg/dL — AB
Nitrite: NEGATIVE
Protein, ur: >=300 mg/dL — AB
Specific Gravity, Urine: 1.017 (ref 1.005–1.030)
WBC, UA: >50 WBC/hpf — ABNORMAL HIGH (ref 0–5)
pH: 6 (ref 5.0–8.0)

## 2019-10-26 LAB — CBG MONITORING, ED: Glucose-Capillary: 220 mg/dL — ABNORMAL HIGH (ref 70–99)

## 2019-10-26 LAB — LACTIC ACID, PLASMA: Lactic Acid, Venous: 1.3 mmol/L (ref 0.5–1.9)

## 2019-10-26 LAB — HEMOGLOBIN A1C
Hgb A1c MFr Bld: 8 % — ABNORMAL HIGH (ref 4.8–5.6)
Mean Plasma Glucose: 182.9 mg/dL

## 2019-10-26 LAB — GLUCOSE, CAPILLARY: Glucose-Capillary: 254 mg/dL — ABNORMAL HIGH (ref 70–99)

## 2019-10-26 MED ORDER — ONDANSETRON HCL 4 MG PO TABS: 4.0000 mg | ORAL_TABLET | Freq: Four times a day (QID) | ORAL | Status: DC | PRN

## 2019-10-26 MED ORDER — INSULIN ASPART 100 UNIT/ML ~~LOC~~ SOLN
0.0000 [IU] | Freq: Every day | SUBCUTANEOUS | Status: DC
  Administered 2019-10-26: 3 [IU] via SUBCUTANEOUS
  Administered 2019-10-28: 2 [IU] via SUBCUTANEOUS

## 2019-10-26 MED ORDER — POTASSIUM CHLORIDE CRYS ER 20 MEQ PO TBCR
40.0000 meq | EXTENDED_RELEASE_TABLET | Freq: Once | ORAL | Status: AC
  Administered 2019-10-26: 40 meq via ORAL
  Filled 2019-10-26: qty 2

## 2019-10-26 MED ORDER — AMLODIPINE BESYLATE 5 MG PO TABS
10.0000 mg | ORAL_TABLET | Freq: Every day | ORAL | Status: DC
  Administered 2019-10-26 – 2019-10-29 (×4): 10 mg via ORAL
  Filled 2019-10-26 (×4): qty 2

## 2019-10-26 MED ORDER — ONDANSETRON HCL 4 MG/2ML IJ SOLN: 4.0000 mg | Freq: Four times a day (QID) | INTRAMUSCULAR | Status: DC | PRN

## 2019-10-26 MED ORDER — ACETAMINOPHEN 325 MG PO TABS
650.0000 mg | ORAL_TABLET | Freq: Once | ORAL | Status: AC
  Administered 2019-10-26: 650 mg via ORAL
  Filled 2019-10-26: qty 2

## 2019-10-26 MED ORDER — ACETAMINOPHEN 650 MG RE SUPP: 650.0000 mg | Freq: Four times a day (QID) | RECTAL | Status: DC | PRN

## 2019-10-26 MED ORDER — ACETAMINOPHEN 325 MG PO TABS
650.0000 mg | ORAL_TABLET | Freq: Four times a day (QID) | ORAL | Status: DC | PRN
  Administered 2019-10-26: 650 mg via ORAL
  Filled 2019-10-26: qty 2

## 2019-10-26 MED ORDER — GABAPENTIN 300 MG PO CAPS
300.0000 mg | ORAL_CAPSULE | Freq: Two times a day (BID) | ORAL | Status: DC
  Administered 2019-10-26 – 2019-10-29 (×6): 300 mg via ORAL
  Filled 2019-10-26 (×6): qty 1

## 2019-10-26 MED ORDER — ENOXAPARIN SODIUM 40 MG/0.4ML ~~LOC~~ SOLN
40.0000 mg | SUBCUTANEOUS | Status: DC
  Administered 2019-10-26 – 2019-10-28 (×3): 40 mg via SUBCUTANEOUS
  Filled 2019-10-26 (×3): qty 0.4

## 2019-10-26 MED ORDER — POTASSIUM CHLORIDE IN NACL 40-0.9 MEQ/L-% IV SOLN
INTRAVENOUS | Status: AC
  Filled 2019-10-26: qty 1000

## 2019-10-26 MED ORDER — POLYETHYLENE GLYCOL 3350 17 G PO PACK: 17.0000 g | PACK | Freq: Every day | ORAL | Status: DC | PRN

## 2019-10-26 MED ORDER — SODIUM CHLORIDE 0.9 % IV SOLN
1.0000 g | Freq: Once | INTRAVENOUS | Status: AC
  Administered 2019-10-26: 1 g via INTRAVENOUS
  Filled 2019-10-26: qty 10

## 2019-10-26 MED ORDER — GABAPENTIN 300 MG PO CAPS: 300.0000 mg | ORAL_CAPSULE | ORAL | Status: DC

## 2019-10-26 MED ORDER — INSULIN ASPART PROT & ASPART (70-30 MIX) 100 UNIT/ML ~~LOC~~ SUSP
40.0000 [IU] | Freq: Two times a day (BID) | SUBCUTANEOUS | Status: DC
  Administered 2019-10-27 (×2): 40 [IU] via SUBCUTANEOUS
  Filled 2019-10-26: qty 10

## 2019-10-26 MED ORDER — SODIUM CHLORIDE 0.9 % IV BOLUS
1000.0000 mL | Freq: Once | INTRAVENOUS | Status: AC
  Administered 2019-10-26: 1000 mL via INTRAVENOUS

## 2019-10-26 MED ORDER — GABAPENTIN 300 MG PO CAPS
600.0000 mg | ORAL_CAPSULE | Freq: Every day | ORAL | Status: DC
  Administered 2019-10-27 – 2019-10-29 (×3): 600 mg via ORAL
  Filled 2019-10-26 (×3): qty 2

## 2019-10-26 MED ORDER — SODIUM CHLORIDE 0.9 % IV SOLN
2.0000 g | INTRAVENOUS | Status: DC
  Administered 2019-10-27 – 2019-10-28 (×2): 2 g via INTRAVENOUS
  Filled 2019-10-26 (×2): qty 20

## 2019-10-26 MED ORDER — INSULIN ASPART 100 UNIT/ML ~~LOC~~ SOLN
0.0000 [IU] | Freq: Three times a day (TID) | SUBCUTANEOUS | Status: DC
  Administered 2019-10-26: 2 [IU] via SUBCUTANEOUS
  Administered 2019-10-27: 5 [IU] via SUBCUTANEOUS
  Administered 2019-10-27: 3 [IU] via SUBCUTANEOUS
  Administered 2019-10-27: 2 [IU] via SUBCUTANEOUS
  Administered 2019-10-28: 3 [IU] via SUBCUTANEOUS
  Administered 2019-10-28: 2 [IU] via SUBCUTANEOUS
  Administered 2019-10-28: 5 [IU] via SUBCUTANEOUS
  Administered 2019-10-29: 7 [IU] via SUBCUTANEOUS
  Administered 2019-10-29: 2 [IU] via SUBCUTANEOUS
  Filled 2019-10-26: qty 1

## 2019-10-26 MED ORDER — ATORVASTATIN CALCIUM 40 MG PO TABS
40.0000 mg | ORAL_TABLET | Freq: Every day | ORAL | Status: DC
  Administered 2019-10-26 – 2019-10-29 (×4): 40 mg via ORAL
  Filled 2019-10-26 (×4): qty 1

## 2019-10-26 MED ORDER — DULOXETINE HCL 60 MG PO CPEP
60.0000 mg | ORAL_CAPSULE | Freq: Every day | ORAL | Status: DC
  Administered 2019-10-26 – 2019-10-29 (×4): 60 mg via ORAL
  Filled 2019-10-26 (×4): qty 1

## 2019-10-26 MED ORDER — METOPROLOL TARTRATE 50 MG PO TABS
50.0000 mg | ORAL_TABLET | Freq: Two times a day (BID) | ORAL | Status: DC
  Administered 2019-10-27 – 2019-10-29 (×5): 50 mg via ORAL
  Filled 2019-10-26 (×5): qty 1

## 2019-10-26 MED ORDER — BENAZEPRIL HCL 10 MG PO TABS
40.0000 mg | ORAL_TABLET | Freq: Every day | ORAL | Status: DC
  Administered 2019-10-26 – 2019-10-28 (×3): 40 mg via ORAL
  Filled 2019-10-26 (×4): qty 4

## 2019-10-26 NOTE — H&P (Signed)
History and Physical    Alec Larson OMV:[id number] DOB: 09/17/1945 DOA: 10/26/2019  PCP: Alec Ramus, MD   Patient coming from: Home  I have personally briefly reviewed patient's old medical records in [place]  Chief Complaint: Home  HPI: Alec Larson is a 74 y.o. male with medical history significant for  DM, HTN, BPH. Presented to the Ed with complaints of pain with urination that started 3- 4 days ago, and increased urinary frequency. Girlfriend present at bedside reports that symptoms have gotten worse over the past 2 days and that this morning patient was confused- he watches Youtube a lot but today he couldnt seem to navigate in the app, or use the remote control and he wasn't able to talk as much.   Patient has been vaccinated for [place].  ED Course: Tmax- 101.1, blood pressure 628 - 366 systolic, WBC- 29.4, K- 3.2, Cr 1.45.  UA- many bacteria, small leuks, > 50 WBCs. Chest - Clear. Head Ct without acute abnormality.  EKG- sinus Without acute abnormalities.  IV ceftriazone given. Hospitalist to admit for UTI.  Review of Systems: As per HPI all other systems reviewed and negative.  Past Medical History:  Diagnosis Date  . Anxiety   . BPH (benign prostatic hyperplasia)   . Diabetes mellitus without complication ([place])   . GERD (gastroesophageal reflux disease)   . H/O renal calculi   . Hypercholesteremia   . Hypertension     Past Surgical History:  Procedure Laterality Date  . BIOPSY  03/19/2019   Procedure: BIOPSY;  Surgeon: Rogene Houston, MD;  [facility];  Service: Endoscopy;;  . CERVICAL SPINE SURGERY    . COLONOSCOPY N/A 03/19/2019   Procedure: COLONOSCOPY;  Surgeon: Rogene Houston, MD;  Location: [facility];  Service: Endoscopy;  Laterality: N/A;  730  . CYSTOSCOPY WITH INSERTION OF UROLIFT N/A 01/20/2019   Procedure: CYSTOSCOPY WITH INSERTION OF UROLIFT;  Surgeon: Cleon Gustin, MD;  Location: [facility] ORS;  Service: Urology;   Laterality: N/A;  30 MINS  . ESOPHAGOGASTRODUODENOSCOPY N/A 03/19/2019   Procedure: ESOPHAGOGASTRODUODENOSCOPY (EGD);  Surgeon: Rogene Houston, MD;  [facility];  Service: Endoscopy;  Laterality: N/A;  730  . POLYPECTOMY  03/19/2019   Procedure: POLYPECTOMY;  Surgeon: Rogene Houston, MD;  [facility];  Service: Endoscopy;;  . TONSILLECTOMY     age 59     reports that he has quit smoking. He has never used smokeless tobacco. He reports previous alcohol use. He reports that he does not use drugs.  Allergies  Allergen Reactions  . Aspirin Anaphylaxis and Hives  . Penicillins Hives    Did it involve swelling of the face/tongue/throat, SOB, or low BP? No Did it involve sudden or severe rash/hives, skin peeling, or any reaction on the inside of your mouth or nose? No Did you need to seek medical attention at a hospital or doctor's office?Never had a reaction When did it last happen? Never had a reaction to this medication. If all above answers are "NO", may proceed with cephalosporin use.   . Bee Venom Hives   Family hx of HTN.  Prior to Admission medications   Medication Sig Start Date End Date Taking? Authorizing Provider  amLODipine (NORVASC) 10 MG tablet Take 10 mg by mouth daily. 12/28/18  Yes [provider]  Ascorbic Acid (VITAMIN C PO) Take 1 tablet by mouth daily.   Yes [provider]  atorvastatin (  LIPITOR) 40 MG tablet Take 40 mg by mouth daily. 12/28/18  Yes [provider]  azelastine (OPTIVAR) 0.05 % ophthalmic solution Place 1 drop into both eyes 2 (two) times daily as needed (itching/swelling eyes. (allergies)).  08/22/18  Yes [provider]  benazepril (LOTENSIN) 40 MG tablet Take 40 mg by mouth daily in the afternoon.  12/28/18  Yes [provider]  DULoxetine (CYMBALTA) 60 MG capsule Take 60 mg by mouth daily. 12/28/18  Yes [provider]  fluticasone (FLONASE) 50 MCG/ACT nasal spray Place 2  sprays into both nostrils daily as needed (allergies.).    Yes [provider]  gabapentin (NEURONTIN) 300 MG capsule Take 300-600 mg by mouth See admin instructions. Take 2 capsules (600 mg) in the morning, 1 capsule (300 mg) by mouth at noon, & 1 capsule (300 mg) by mouth at night 12/28/18  Yes [provider]  glimepiride (AMARYL) 4 MG tablet Take 4 mg by mouth daily with breakfast. 12/28/18  Yes [provider]  hydrochlorothiazide (HYDRODIURIL) 25 MG tablet Take 25 mg by mouth daily. 12/28/18  Yes [provider]  loperamide (IMODIUM) 2 MG capsule Take 1 capsule (2 mg total) by mouth daily with breakfast. Patient taking differently: Take 2 mg by mouth daily as needed for diarrhea or loose stools.  03/19/19  Yes Rehman, Mechele Dawley, MD  metoprolol tartrate (LOPRESSOR) 50 MG tablet Take 50 mg by mouth 2 (two) times daily with a meal. 12/28/18  Yes [provider]  NOVOLIN 70/30 (70-30) 100 UNIT/ML injection Inject 40-60 Units into the skin See admin instructions. Inject 40 units into the skin in the morning and 60 units at night 12/28/18  Yes [provider]  omeprazole (PRILOSEC) 20 MG capsule Take 20 mg by mouth daily before breakfast.  12/28/18  Yes [provider]  oxyCODONE-acetaminophen (PERCOCET) 10-325 MG tablet Take 1 tablet by mouth every 6 (six) hours as needed for pain. 01/20/19  Yes McKenzie, Candee Furbish, MD  sitaGLIPtin (JANUVIA) 50 MG tablet Take 50 mg by mouth daily.  07/28/19 07/27/20 Yes [provider]    Physical Exam: Vitals:   10/26/19 1330 10/26/19 1345 10/26/19 1400 10/26/19 1415  BP: (!) 154/93  129/62   Pulse: 90 93 93 92  Resp: 15 19 16 16   Temp:      TempSrc:      SpO2: 97% 96% 94% 94%  Weight:      Height:        Constitutional: NAD, calm, comfortable Vitals:   10/26/19 1330 10/26/19 1345 10/26/19 1400 10/26/19 1415  BP: (!) 154/93  129/62   Pulse: 90 93 93 92  Resp: 15 19 16 16   Temp:        TempSrc:      SpO2: 97% 96% 94% 94%  Weight:      Height:       Eyes: PERRL, lids and conjunctivae normal ENMT: Mucous membranes are moist.  Neck: normal, supple, no masses, no thyromegaly Respiratory:  Normal respiratory effort. No accessory muscle use.  Cardiovascular: Regular rate and rhythm, No extremity edema. 2+ pedal pulses.   Abdomen: no tenderness, no masses palpated. No hepatosplenomegaly. Bowel sounds positive.  Musculoskeletal: no clubbing / cyanosis. No joint deformity upper and lower extremities. Good ROM, no contractures. Normal muscle tone.  Skin: no rashes, lesions, ulcers. No induration Neurologic: No apparent cranial nerve abnormalities, 5/5 strength in all extremities  Psychiatric: Normal judgment and insight. Alert and oriented x 3.  Normal mood.   Labs on Admission: I have personally reviewed following labs and imaging studies  CBC: Recent Labs  Lab 10/26/19 1312  WBC 20.5*  NEUTROABS 16.4*  HGB 13.2  HCT 40.9  MCV 88.9  PLT 024   Basic Metabolic Panel: Recent Labs  Lab 10/26/19 1312  NA 137  K 3.2*  CL 98  CO2 27  GLUCOSE 212*  BUN 24*  CREATININE 1.45*  CALCIUM 8.9   Liver Function Tests: Recent Labs  Lab 10/26/19 1312  AST 26  ALT 22  ALKPHOS 52  BILITOT 1.4*  PROT 7.5  ALBUMIN 3.9   Urine analysis:    Component Value Date/Time   COLORURINE YELLOW 10/26/2019 1312   APPEARANCEUR HAZY (A) 10/26/2019 1312   APPEARANCEUR Hazy (A) 10/25/2019 1513   LABSPEC 1.017 10/26/2019 1312   PHURINE 6.0 10/26/2019 1312   GLUCOSEU 50 (A) 10/26/2019 1312   HGBUR MODERATE (A) 10/26/2019 1312   BILIRUBINUR NEGATIVE 10/26/2019 1312   BILIRUBINUR Negative 10/25/2019 1513   KETONESUR 5 (A) 10/26/2019 1312   PROTEINUR >=300 (A) 10/26/2019 1312   NITRITE NEGATIVE 10/26/2019 1312   LEUKOCYTESUR SMALL (A) 10/26/2019 1312    Radiological Exams on Admission: CT Head Wo Contrast  Result Date: 10/26/2019 CLINICAL DATA:  Mental status change, unknown  cause. Additional history provided: Recent UTI with altered mental status for 2-3 days. EXAM: CT HEAD WITHOUT CONTRAST TECHNIQUE: Contiguous axial images were obtained from the base of the skull through the vertex without intravenous contrast. COMPARISON:  Head CT 08/08/2019. FINDINGS: Brain: Stable, mild generalized parenchymal atrophy. 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 Stable background moderate ill-defined hypoattenuation within the cerebral white matter which is nonspecific, but consistent with chronic small vessel ischemic disease. There is no acute intracranial hemorrhage. No demarcated cortical infarct is identified. No extra-axial fluid collection. No evidence of intracranial mass. No midline shift. Vascular: No hyperdense vessel.  Atherosclerotic calcifications Skull: Normal. Negative for fracture or focal lesion. Sinuses/Orbits: Visualized orbits show no acute finding. Small osteoma within the inferior right frontal sinus. Moderate ethmoid sinus mucosal thickening. No significant mastoid effusion IMPRESSION: No CT evidence of acute intracranial abnormality. 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 Stable background mild generalized parenchymal atrophy and moderate cerebral white matter chronic small vessel ischemic disease. Moderate ethmoid sinus mucosal thickening. Electronically Signed   By: Kellie Simmering DO   On: 10/26/2019 15:05   DG Chest Port 1 View  Result Date: 10/26/2019 CLINICAL DATA:  Shortness of breath and confusion. EXAM: PORTABLE CHEST 1 VIEW COMPARISON:  CT of the chest on 08/08/2019 at [place]: The heart size and mediastinal contours are within normal limits. There is no evidence of pulmonary edema, consolidation, pneumothorax or pleural fluid. The visualized skeletal structures are unremarkable. IMPRESSION: No active disease.  Electronically Signed   By: Aletta Edouard M.D.   On: 10/26/2019 14:14    EKG: Independently reviewed. Sinus, QTc- 472. Insignificant T wave abnormalities AVL and V2. No old EKG to compare.  Assessment/Plan Principal Problem:   Sepsis ([place]) Active Problems:   HTN (hypertension)   DM (diabetes mellitus) ([place])   Sepsis- fever 101, leukocytosis 20. Likley 2/2 UTI- with UA suggestiove- >50 WBCs, + nitrite. Chest Xray- clear. - Obtain lactic acid - Obtain Urine culture - Obtain Blood cultures - Continue IV ceftriazone 2g daily- adjusted for weight - 1 L bolus given. Continue N/s +  40 KCL 100cc/hr x 10 hrs - BMP and CBC in a.m  Metabolic Encephalopathy- reported confusion, at the time of my evaluation patient is awake, alert and oriented and able to give history. Head  Ct without acute abnormality. - IVF, IV antibiotics - Resume cymbalta, gabapentin  Hypokalemia - 3.2. On HCTZ. - Check Mag - Replete  DM- Glucose 212. He tells me he is compliant with januvia, Amaryl and 70/30- 45u a.m, 70u p.m. - Resume 70/30 at 40u BID for now - Hold home januvia and amaryl for now - SSI - Resume gabapentin  HTN- Stable. Reports compliance with 4 blood pressure meds. - Resume home Norvasc, benazepril, metoprolol - Hold HCTZ for now while hydratying    DVT prophylaxis: Lovenox. Code Status: Full code Family Communication: Significant other at bedside Disposition Plan:  ~ 2 days, pending treatment of UTI, blood and urine cultures. Consults called: None Admission status: Inpatient, tele  I certify that at the point of admission it is my clinical judgment that the patient will require inpatient hospital care spanning beyond 2 midnights from the point of admission due to high intensity of service, high risk for further deterioration and high frequency of surveillance required.    Bethena Roys MD [facility]  10/26/2019, 4:21 PM

## 2019-10-26 NOTE — Telephone Encounter (Signed)
Pt girlfriend called stating pt was feeling worse and running a fever. Dr. Alyson Ingles made aware and orders given to send pt to ER for eval. Ms. Osie Bond voiced  Understanding.

## 2019-10-26 NOTE — ED Triage Notes (Signed)
Pt's girlfriend reports pt has had burning with urination and frequency of urination for the past few days.  Reports fever and confusion as well.  Pt already had decreased mobility but has gotten worse over the past couple of days.  Saw Dr. Glade Lloyd yesterday and gave urine sample but didn't have the results.

## 2019-10-26 NOTE — ED Provider Notes (Signed)
[facility] EMERGENCY DEPARTMENT Provider Note   CSN: [id number] Arrival date & time: 10/26/19  1216     History Chief Complaint  Patient presents with  . Fever    Alec Larson is a 74 y.o. male.  Patient with fever confusion and foul-smelling urine.  No vomiting  The history is provided by the patient and a relative. No language interpreter was used.  Fever Max temp prior to arrival:  Unknown Temp source:  Oral Severity:  Moderate Onset quality:  Sudden Timing:  Constant Progression:  Worsening Chronicity:  New Relieved by:  Nothing Worsened by:  Nothing Ineffective treatments:  Acetaminophen      Past Medical History:  Diagnosis Date  . Anxiety   . BPH (benign prostatic hyperplasia)   . Diabetes mellitus without complication ([place])   . GERD (gastroesophageal reflux disease)   . H/O renal calculi   . Hypercholesteremia   . Hypertension     Patient Active Problem List   Diagnosis Date Noted  . Sepsis ([place]) 10/26/2019  . Benign prostatic hyperplasia with urinary obstruction 02/03/2019  . Incomplete emptying of bladder 02/03/2019    Past Surgical History:  Procedure Laterality Date  . BIOPSY  03/19/2019   Procedure: BIOPSY;  Surgeon: Rogene Houston, MD;  [facility];  Service: Endoscopy;;  . CERVICAL SPINE SURGERY    . COLONOSCOPY N/A 03/19/2019   Procedure: COLONOSCOPY;  Surgeon: Rogene Houston, MD;  [facility];  Service: Endoscopy;  Laterality: N/A;  730  . CYSTOSCOPY WITH INSERTION OF UROLIFT N/A 01/20/2019   Procedure: CYSTOSCOPY WITH INSERTION OF UROLIFT;  Surgeon: Cleon Gustin, MD;  Location: [facility];  Service: Urology;  Laterality: N/A;  30 MINS  . ESOPHAGOGASTRODUODENOSCOPY N/A 03/19/2019   Procedure: ESOPHAGOGASTRODUODENOSCOPY (EGD);  Surgeon: Rogene Houston, MD;  [facility];  Service: Endoscopy;  Laterality: N/A;  730  . POLYPECTOMY  03/19/2019   Procedure: POLYPECTOMY;  Surgeon: Rogene Houston, MD;   [facility];  Service: Endoscopy;;  . TONSILLECTOMY     age 55       No family history on file.  Social History   Tobacco Use  . Smoking status: Former Research scientist (life sciences)  . Smokeless tobacco: Never Used  Substance Use Topics  . Alcohol use: Not Currently  . Drug use: Never    Home Medications Prior to Admission medications   Medication Sig Start Date End Date Taking? Authorizing Provider  amLODipine (NORVASC) 10 MG tablet Take 10 mg by mouth daily. 12/28/18  Yes [provider]  Ascorbic Acid (VITAMIN C PO) Take 1 tablet by mouth daily.   Yes [provider]  atorvastatin (LIPITOR) 40 MG tablet Take 40 mg by mouth daily. 12/28/18  Yes [provider]  azelastine (OPTIVAR) 0.05 % ophthalmic solution Place 1 drop into both eyes 2 (two) times daily as needed (itching/swelling eyes. (allergies)).  08/22/18  Yes [provider]  benazepril (LOTENSIN) 40 MG tablet Take 40 mg by mouth daily in the afternoon.  12/28/18  Yes [provider]  DULoxetine (CYMBALTA) 60 MG capsule Take 60 mg by mouth daily. 12/28/18  Yes [provider]  fluticasone (FLONASE) 50 MCG/ACT nasal spray Place 2 sprays into both nostrils daily as needed (allergies.).    Yes [provider]  gabapentin (NEURONTIN) 300 MG capsule Take 300-600 mg by mouth See admin instructions. Take 2 capsules (600 mg) in the morning, 1 capsule (300 mg) by mouth at  noon, & 1 capsule (300 mg) by mouth at night 12/28/18  Yes [provider]  glimepiride (AMARYL) 4 MG tablet Take 4 mg by mouth daily with breakfast. 12/28/18  Yes [provider]  hydrochlorothiazide (HYDRODIURIL) 25 MG tablet Take 25 mg by mouth daily. 12/28/18  Yes [provider]  loperamide (IMODIUM) 2 MG capsule Take 1 capsule (2 mg total) by mouth daily with breakfast. Patient taking differently: Take 2 mg by mouth daily as needed for diarrhea or loose stools.  03/19/19  Yes Rehman,  Mechele Dawley, MD  metoprolol tartrate (LOPRESSOR) 50 MG tablet Take 50 mg by mouth 2 (two) times daily with a meal. 12/28/18  Yes [provider]  NOVOLIN 70/30 (70-30) 100 UNIT/ML injection Inject 40-60 Units into the skin See admin instructions. Inject 40 units into the skin in the morning and 60 units at night 12/28/18  Yes [provider]  omeprazole (PRILOSEC) 20 MG capsule Take 20 mg by mouth daily before breakfast.  12/28/18  Yes [provider]  oxyCODONE-acetaminophen (PERCOCET) 10-325 MG tablet Take 1 tablet by mouth every 6 (six) hours as needed for pain. 01/20/19  Yes McKenzie, Candee Furbish, MD  sitaGLIPtin (JANUVIA) 50 MG tablet Take 50 mg by mouth daily.  07/28/19 07/27/20 Yes [provider]    Allergies    Aspirin, Penicillins, and Bee venom  Review of Systems   Review of Systems  Unable to perform ROS: Mental status change  Constitutional: Positive for fever.    Physical Exam Updated Vital Signs BP 129/62   Pulse 92   Temp (!) 101.1 F (38.4 C) (Oral)   Resp 16   Ht 5\' 5"  (1.651 m)   Wt 99.8 kg   SpO2 94%   BMI 36.61 kg/m   Physical Exam Vitals and nursing note reviewed.  Constitutional:      Appearance: He is well-developed.  HENT:     Head: Normocephalic.     Nose: Nose normal.  Eyes:     General: No scleral icterus.    Conjunctiva/sclera: Conjunctivae normal.  Neck:     Thyroid: No thyromegaly.  Cardiovascular:     Rate and Rhythm: Normal rate and regular rhythm.     Heart sounds: No murmur heard.  No friction rub. No gallop.   Pulmonary:     Breath sounds: No stridor. No wheezing or rales.  Chest:     Chest wall: No tenderness.  Abdominal:     General: There is no distension.     Tenderness: There is no abdominal tenderness. There is no rebound.  Musculoskeletal:        General: Normal range of motion.     Cervical back: Neck supple.  Lymphadenopathy:     Cervical: No cervical adenopathy.  Skin:    Findings: No  erythema or rash.  Neurological:     Mental Status: He is alert.     Motor: No abnormal muscle tone.     Coordination: Coordination normal.     Comments: Patient oriented to person place and mildly confused  Psychiatric:        Behavior: Behavior normal.     ED Results / Procedures / Treatments   Labs (all labs ordered are listed, but only abnormal results are displayed) Labs Reviewed  URINALYSIS, ROUTINE W REFLEX MICROSCOPIC - Abnormal; Notable for the following components:      Result Value   APPearance HAZY (*)    Glucose, UA 50 (*)  Hgb urine dipstick MODERATE (*)    Ketones, ur 5 (*)    Protein, ur >=300 (*)    Leukocytes,Ua SMALL (*)    WBC, UA >50 (*)    Bacteria, UA MANY (*)    All other components within normal limits  CBC WITH DIFFERENTIAL/PLATELET - Abnormal; Notable for the following components:   WBC 20.5 (*)    Neutro Abs 16.4 (*)    Monocytes Absolute 2.3 (*)    All other components within normal limits  COMPREHENSIVE METABOLIC PANEL - Abnormal; Notable for the following components:   Potassium 3.2 (*)    Glucose, Bld 212 (*)    BUN 24 (*)    Creatinine, Ser 1.45 (*)    Total Bilirubin 1.4 (*)    GFR calc non Af Amer 47 (*)    GFR calc Af Amer 54 (*)    All other components within normal limits  SARS CORONAVIRUS 2 BY RT PCR (HOSPITAL ORDER, [place] [facility])    EKG None  Radiology CT Head Wo Contrast  Result Date: 10/26/2019 CLINICAL DATA:  Mental status change, unknown cause. Additional history provided: Recent UTI with altered mental status for 2-3 days. EXAM: CT HEAD WITHOUT CONTRAST TECHNIQUE: Contiguous axial images were obtained from the base of the skull through the vertex without intravenous contrast. COMPARISON:  Head CT 08/08/2019. FINDINGS: Brain: Stable, mild generalized parenchymal atrophy. 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 Stable background  moderate ill-defined hypoattenuation within the cerebral white matter which is nonspecific, but consistent with chronic small vessel ischemic disease. There is no acute intracranial hemorrhage. No demarcated cortical infarct is identified. No extra-axial fluid collection. No evidence of intracranial mass. No midline shift. Vascular: No hyperdense vessel.  Atherosclerotic calcifications Skull: Normal. Negative for fracture or focal lesion. Sinuses/Orbits: Visualized orbits show no acute finding. Small osteoma within the inferior right frontal sinus. Moderate ethmoid sinus mucosal thickening. No significant mastoid effusion IMPRESSION: No CT evidence of acute intracranial abnormality. 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 Stable background mild generalized parenchymal atrophy and moderate cerebral white matter chronic small vessel ischemic disease. Moderate ethmoid sinus mucosal thickening. Electronically Signed   By: Kellie Simmering DO   On: 10/26/2019 15:05   DG Chest Port 1 View  Result Date: 10/26/2019 CLINICAL DATA:  Shortness of breath and confusion. EXAM: PORTABLE CHEST 1 VIEW COMPARISON:  CT of the chest on 08/08/2019 at [place]: The heart size and mediastinal contours are within normal limits. There is no evidence of pulmonary edema, consolidation, pneumothorax or pleural fluid. The visualized skeletal structures are unremarkable. IMPRESSION: No active disease. Electronically Signed   By: Aletta Edouard M.D.   On: 10/26/2019 14:14    Procedures Procedures (including critical care time)  Medications Ordered in ED Medications  acetaminophen (TYLENOL) tablet 650 mg (650 mg Oral Given 10/26/19 1335)  sodium chloride 0.9 % bolus 1,000 mL (0 mLs Intravenous Stopped 10/26/19 1418)  cefTRIAXone (ROCEPHIN) 1 g in sodium chloride 0.9 % 100 mL IVPB (0 g Intravenous Stopped 10/26/19 1417)    ED Course  I have reviewed the  triage vital signs and the nursing notes.  Pertinent labs & imaging results that were available during my care of the patient were reviewed by me and considered in my medical decision making (see chart for details).    MDM Rules/Calculators/A&P  Patient with urinary tract infection.  He will be admitted to medicine      This patient presents to the ED for concern of foul-smelling urine, this involves an extensive number of treatment options, and is a complaint that carries with it a high risk of complications and morbidity.  The differential diagnosis includes UTI   Lab Tests:   I Ordered, reviewed, and interpreted labs, which included CBC and chemistry that showed white count 20,000 and potassium low 3.2  Medicines ordered:   I ordered medication Rocephin for UTI  Imaging Studies ordered:   I ordered imaging studies which included chest x-ray and CT head  I independently visualized and interpreted imaging which showed possible sinus infection  Additional history obtained:   Additional history obtained from CT head and chest x-ray  Previous records obtained and reviewed possible signs of infection  Consultations Obtained:   I consulted hospitalist and discussed lab and imaging findings  Reevaluation:  After the interventions stated above, I reevaluated the patient and found unchanged  Critical Interventions:  .   Final Clinical Impression(s) / ED Diagnoses Final diagnoses:  Acute cystitis without hematuria    Rx / DC Orders ED Discharge Orders    None       Milton Ferguson, MD 10/27/19 1749

## 2019-10-27 LAB — BASIC METABOLIC PANEL
Anion gap: 11 (ref 5–15)
BUN: 16 mg/dL (ref 8–23)
CO2: 23 mmol/L (ref 22–32)
Calcium: 8.4 mg/dL — ABNORMAL LOW (ref 8.9–10.3)
Chloride: 103 mmol/L (ref 98–111)
Creatinine, Ser: 1.26 mg/dL — ABNORMAL HIGH (ref 0.61–1.24)
GFR calc Af Amer: >60 mL/min (ref >60)
GFR calc non Af Amer: 55 mL/min — ABNORMAL LOW (ref >60)
Glucose, Bld: 237 mg/dL — ABNORMAL HIGH (ref 70–99)
Potassium: 3.7 mmol/L (ref 3.5–5.1)
Sodium: 137 mmol/L (ref 135–145)

## 2019-10-27 LAB — CBC
HCT: 37.6 % — ABNORMAL LOW (ref 39.0–52.0)
Hemoglobin: 11.8 g/dL — ABNORMAL LOW (ref 13.0–17.0)
MCH: 28.4 pg (ref 26.0–34.0)
MCHC: 31.4 g/dL (ref 30.0–36.0)
MCV: 90.6 fL (ref 80.0–100.0)
Platelets: 200 10*3/uL (ref 150–400)
RBC: 4.15 MIL/uL — ABNORMAL LOW (ref 4.22–5.81)
RDW: 15 % (ref 11.5–15.5)
WBC: 19.3 10*3/uL — ABNORMAL HIGH (ref 4.0–10.5)
nRBC: 0 % (ref 0.0–0.2)

## 2019-10-27 LAB — GLUCOSE, CAPILLARY
Glucose-Capillary: 222 mg/dL — ABNORMAL HIGH (ref 70–99)
Glucose-Capillary: 298 mg/dL — ABNORMAL HIGH (ref 70–99)
Glucose-Capillary: 155 mg/dL — ABNORMAL HIGH (ref 70–99)
Glucose-Capillary: 180 mg/dL — ABNORMAL HIGH (ref 70–99)

## 2019-10-27 MED ORDER — POTASSIUM CHLORIDE CRYS ER 20 MEQ PO TBCR
40.0000 meq | EXTENDED_RELEASE_TABLET | Freq: Once | ORAL | Status: AC
  Administered 2019-10-27: 40 meq via ORAL
  Filled 2019-10-27: qty 2

## 2019-10-27 MED ORDER — MAGNESIUM SULFATE 2 GM/50ML IV SOLN
2.0000 g | Freq: Once | INTRAVENOUS | Status: AC
  Administered 2019-10-27: 2 g via INTRAVENOUS
  Filled 2019-10-27: qty 50

## 2019-10-27 NOTE — Plan of Care (Addendum)
  Problem: Acute Rehab PT Goals(only PT should resolve) Goal: Pt Will Go Supine/Side To Sit Outcome: Progressing Flowsheets (Taken 10/27/2019 1528) Pt will go Supine/Side to Sit: Independently Goal: Patient Will Transfer Sit To/From Stand Outcome: Progressing Flowsheets (Taken 10/27/2019 1528) Patient will transfer sit to/from stand: Independently Goal: Pt Will Transfer Bed To Chair/Chair To Bed Outcome: Progressing Flowsheets (Taken 10/27/2019 1528) Pt will Transfer Bed to Chair/Chair to Bed: with modified independence Goal: Pt Will Ambulate Outcome: Progressing Flowsheets (Taken 10/27/2019 1528) Pt will Ambulate: . > 125 feet . with least restrictive assistive device . with min guard assist   3:30 PM , 10/27/19 Karlyn Agee, SPT Physical Therapy with [facility] [phone] office   During this treatment session, the therapist was present, participating in and directing the treatment.  3:58 PM, 10/27/19 Lonell Grandchild, MPT Physical Therapist with [facility] [phone] office [phone] mobile phone

## 2019-10-27 NOTE — Evaluation (Addendum)
Physical Therapy Evaluation Patient Details Name: Alec Larson MRN: [id number] DOB: [birth year] Today's Date: 10/27/2019   History of Present Illness  Alec Larson is a 74 y.o. male with medical history significant for  DM, HTN, BPH. Presented to the Ed with complaints of pain with urination that started 3- 4 days ago, and increased urinary frequency. Girlfriend present at bedside reports that symptoms have gotten worse over the past 2 days and that this morning patient was confused- he watches Youtube a lot but today he couldnt seem to navigate in the app, or use the remote control and he wasn't able to talk as much.  Patient has been vaccinated for [place]. ED Course: Tmax- 101.1, blood pressure 650 - 354 systolic, WBC- 65.6, K- 3.2, Cr 1.45.  UA- many bacteria, small leuks, > 50 WBCs. Chest - Clear. Head Ct without acute abnormality.  EKG- sinus Without acute abnormalities.  IV ceftriazone given. Hospitalist to admit for UTI.    Clinical Impression  The patient demonstrated modified independence with supine to sit transfer today. He tolerated upright sitting balance with increased sway, but no LOB. He performed sit to stand min guard and had slight unsteadiness upon standing with an increase in confusion. With the addition of the RW the patient ambulated 70 feet min guard/min assist. He demonstrated difficulty with navigating hallway obstacles and required frequent VC's in order to keep his body within the RW. Patient tolerated sitting up in chair after therapy- RN notified about EOS position and cognitive status. PLAN: The patient will continue to benefit from skilled physical therapy services in hospital at recommended venue below in order to improve balance, gait, and ADL's to promote independence in functional activities.      Follow Up Recommendations Home health PT;Supervision for mobility/OOB    Equipment Recommendations  None recommended by PT    Recommendations for Other Services        Precautions / Restrictions Precautions Precautions: Fall Restrictions Weight Bearing Restrictions: No      Mobility  Bed Mobility Overal bed mobility: Needs Assistance Bed Mobility: Supine to Sit     Supine to sit: Modified independent (Device/Increase time)     General bed mobility comments: decreased time and used bed rails, slightly impulsive to sit EOB and stand  Transfers Overall transfer level: Needs assistance Equipment used: None Transfers: Sit to/from Stand Sit to Stand: Min guard         General transfer comment: unsteadiness upon standing with increase in confusion  Ambulation/Gait Ambulation/Gait assistance: Min assist;Min guard Gait Distance (Feet): 70 Feet Assistive device: Rolling walker (2 wheeled) Gait Pattern/deviations: Step-through pattern;Drifts right/left Gait velocity: decreased   General Gait Details: frequent VC's to keep RW closer to body, difficulty navigating hallway obstacles with multiple near collisions only prevented by PT intervention/assistance, multiple missteps w TC's to correct; extra time w wide BOS on turns  Stairs            Wheelchair Mobility    Modified Rankin (Stroke Patients Only)       Balance Overall balance assessment: Needs assistance;History of Falls Sitting-balance support: Bilateral upper extremity supported;Feet supported Sitting balance-Leahy Scale: Fair Sitting balance - Comments: periodically would demonstrate increased sway sitting EOB, but without LOB   Standing balance support: Bilateral upper extremity supported;During functional activity Standing balance-Leahy Scale: Fair Standing balance comment: frequent VC's to promote proper use of RW, feet often placed too far behind RW  Pertinent Vitals/Pain Pain Assessment: 0-10 Pain Score: 6  Pain Location: neck Pain Descriptors / Indicators: Sore;Grimacing Pain Intervention(s): Monitored during  session;Limited activity within patient's tolerance    Home Living Family/patient expects to be discharged to:: Private residence Living Arrangements: Spouse/significant other Available Help at Discharge: Family;Home health;Available 24 hours/day;Available PRN/intermittently (girlfriend help 24/7, HHPT intermittently) Type of Home: House Home Access: Stairs to enter Entrance Stairs-Rails: Right;Left;Can reach both Entrance Stairs-Number of Steps: 8 Home Layout: One level Home Equipment: Walker - 2 wheels;Cane - quad;Cane - single point;Toilet riser;Grab bars - tub/shower;Wheelchair - manual      Prior Function Level of Independence: Independent         Comments: does not use AD for household ambulation     Hand Dominance   Dominant Hand: Right    Extremity/Trunk Assessment   Upper Extremity Assessment Upper Extremity Assessment: Overall WFL for tasks assessed    Lower Extremity Assessment Lower Extremity Assessment: Overall WFL for tasks assessed    Cervical / Trunk Assessment Cervical / Trunk Assessment: Normal  Communication   Communication: No difficulties  Cognition Arousal/Alertness: Awake/alert Behavior During Therapy: WFL for tasks assessed/performed Overall Cognitive Status: No family/caregiver present to determine baseline cognitive functioning                                 General Comments: mild confusion throughout session, increased once standing; difficulty following VC's for proper safety with AD use      General Comments      Exercises     Assessment/Plan    PT Assessment Patient needs continued PT services  PT Problem List Decreased strength;Decreased mobility;Decreased safety awareness;Decreased range of motion;Decreased coordination;Decreased activity tolerance;Decreased cognition;Decreased balance;Decreased knowledge of use of DME       PT Treatment Interventions DME instruction;Therapeutic exercise;Gait training;Balance  training;Functional mobility training;Therapeutic activities;Patient/family education    PT Goals (Current goals can be found in the Care Plan section)  Acute Rehab PT Goals Patient Stated Goal: go home PT Goal Formulation: With patient Time For Goal Achievement: 11/03/19 Potential to Achieve Goals: Good    Frequency Min 3X/week   Barriers to discharge        Co-evaluation               AM-PAC PT "6 Clicks" Mobility  Outcome Measure Help needed turning from your back to your side while in a flat bed without using bedrails?: None Help needed moving from lying on your back to sitting on the side of a flat bed without using bedrails?: None Help needed moving to and from a bed to a chair (including a wheelchair)?: A Little Help needed standing up from a chair using your arms (e.g., wheelchair or bedside chair)?: A Little Help needed to walk in hospital room?: A Little Help needed climbing 3-5 steps with a railing? : A Little 6 Click Score: 20    End of Session Equipment Utilized During Treatment: Gait belt Activity Tolerance: Patient tolerated treatment well Patient left: in chair;with call bell/phone within reach;with chair alarm set Nurse Communication: Mobility status;Other (comment) (cog status) PT Visit Diagnosis: Unsteadiness on feet (R26.81);Other abnormalities of gait and mobility (R26.89);Muscle weakness (generalized) (M62.81);History of falling (Z91.81);Repeated falls (R29.6)    Time: 8242-3536 PT Time Calculation (min) (ACUTE ONLY): 22 min   Charges:   PT Evaluation $PT Eval Moderate Complexity: 1 Mod PT Treatments $Therapeutic Activity: 8-22 mins  3:58 PM , 10/27/19 Karlyn Agee, SPT Physical Therapy with [facility] [phone] office   During this treatment session, the therapist was present, participating in and directing the treatment.  3:58 PM, 10/27/19 Lonell Grandchild, MPT Physical Therapist with [facility] 336 [phone] office [phone] mobile phone

## 2019-10-27 NOTE — Progress Notes (Signed)
Patient had an 11 beat run of  V-Tach.  Asymptomatic, vitals stable.  On-call MD notified via text page.  No new orders at this time.  Will continue to monitor.

## 2019-10-27 NOTE — TOC Initial Note (Signed)
Transition of Care [facility]) - Initial/Assessment Note    Patient Details  Name: Alec Larson MRN: [id number] Date of Birth: 11-19-1945  [facility] CM/SW Contact:    Salome Arnt, [place] Phone Number: 10/27/2019, 3:47 PM  Clinical Narrative: Pt admitted due to sepsis. He reports he lives with a friend. Pt indicates at baseline, he is fairly independent. Pt said he has been going to outpatient rehab prior to admission. PT evaluated pt and recommend home health. Pt is agreeable to referral to [facility]. He is only interested in PT at this time. LCSW spoke with Tommi Rumps with [facility] who accepts referral. TOC will follow.                   Expected Discharge Plan: [place] Barriers to Discharge: Continued Medical Work up   Patient Goals and CMS Choice Patient states their goals for this hospitalization and ongoing recovery are:: return home      Expected Discharge Plan and Services Expected Discharge Plan: [place] In-house Referral: Clinical Social Work   Post Acute Care Choice: [place] arrangements for the past 2 months: [place]: PT [place]: [place] Date [place]: 10/27/19 Time HH Agency Contacted: 94 Representative spoke with at [place]: Meredeth Ide  Prior Living Arrangements/Services Living arrangements for the past 2 months: [place] Lives with:: Friends Patient language and need for interpreter reviewed:: Yes Do you feel safe going back to the place where you live?: Yes      Need for Family Participation in Patient Care: No (Comment) Care giver support system in place?: Yes (comment) Current home services: DME (walker, cane, wheelchair) Criminal Activity/Legal Involvement Pertinent to Current Situation/Hospitalization: No - Comment as needed  Activities of Daily Living Home Assistive Devices/Equipment: Dentures  (specify type), CBG Meter, Eyeglasses ADL Screening (condition at time of admission) Patient's cognitive ability adequate to safely complete daily activities?: Yes Is the patient deaf or have difficulty hearing?: No Does the patient have difficulty seeing, even when wearing glasses/contacts?: No Does the patient have difficulty concentrating, remembering, or making decisions?: No Patient able to express need for assistance with ADLs?: Yes Does the patient have difficulty dressing or bathing?: No Independently performs ADLs?: Yes (appropriate for developmental age) Does the patient have difficulty walking or climbing stairs?: No Weakness of Legs: Both Weakness of Arms/Hands: None  Permission Sought/Granted   Permission granted to share information with : Yes, Verbal Permission Granted     Permission granted to share info w AGENCY: [facility]        Emotional Assessment   Attitude/Demeanor/Rapport: Engaged Affect (typically observed): Accepting Orientation: : Oriented to Self, Oriented to Place, Oriented to  Time, Oriented to Situation Alcohol / Substance Use: Not Applicable Psych Involvement: No (comment)  Admission diagnosis:  Acute cystitis without hematuria [N30.00] Sepsis ([place]) [A41.9] Patient Active Problem List   Diagnosis Date Noted  . Sepsis ([place]) 10/26/2019  . HTN (hypertension) 10/26/2019  . DM (diabetes mellitus) ([place]) 10/26/2019  . Benign prostatic hyperplasia with urinary obstruction 02/03/2019  . Incomplete emptying of bladder 02/03/2019   PCP:  Alfonso Ramus, MD Pharmacy:   [facility] [place] [address] [place] [address] Phone: [phone]  Fax: [phone]     Social Determinants of Health (SDOH) Interventions    Readmission Risk Interventions No flowsheet data found.

## 2019-10-27 NOTE — Progress Notes (Signed)
PROGRESS NOTE  Alec Larson  DOB: Aug 29, 1945  PCP: Alfonso Ramus, MD OYD:[id number]  DOA: 10/26/2019  LOS: 1 day   Chief Complaint  Patient presents with  . Fever    Brief narrative: Alec Larson is a 74 y.o. male with medical history significant for  DM, HTN, BPH who lives at home with his girlfriend and gets around with a walker/cane. Patient presented to the ED on 9/14 with reported history of progressively worsening dysuria, urinary frequency, altered mental status and generalized weakness.  He also reports he had a fall sustaining some bruising to the hip. Patient has been vaccinated for [place].  In the ED, patient had a fever up to 101.1, WC count elevated to 20.5, creatinine elevated to 1.45. Urinalysis with many bacteria, small leukocytes. CT head normal. Patient was admitted to hospitalist service for sepsis secondary to UTI.  Subjective: Patient was seen and examined this morning.  Presented to the Caucasian male.  Propped up in bed.  Not in distress. T-max 101.7 at 8 PM last night.  Blood pressure 160s.  Glucose level this morning 222. Labs from this morning with WBC count improving at 19.3.  Assessment/Plan: Sepsis-present on admission UTI -Presented with dysuria, frequency, fever, leukocytosis and urinalysis suggestive of UTI. -Managed per sepsis protocol with IV antibiotics, IV fluid.   -Continue IV Rocephin.  Adequately hydrated.  Pending blood culture and urine culture report. -Continue to monitor temperature and WBC trend. Recent Labs  Lab 10/26/19 1312 10/26/19 1718 10/27/19 0612  WBC 20.5*  --  19.3*  LATICACIDVEN  --  1.3  --    Acute metabolic encephalopathy -Likely secondary to sepsis. -CT head normal. -Mental status improving.  Continue to monitor. -Continue cymbalta, gabapentin  Hypokalemia/hypomagnesemia -Likely secondary to diuretics. -Potassium and magnesium level low, remains low this morning. -Oral potassium and IV  magnesium ordered. Recent Labs  Lab 10/26/19 1312 10/26/19 1718 10/27/19 0612  K 3.2*  --  3.7  MG  --  1.9  --    Diabetes mellitus type 2 -Home meds include insulin 70/30, 45 units in a.m. and 70 units in p.m. along with Januvia and Amaryl. -He was resumed on 70/30 insulin 40 units twice daily.  Oral meds on hold. -Sliding scale insulin with Accu-Cheks -DM Neurontin for neuropathy. Lab Results  Component Value Date   HGBA1C 8.0 (H) 10/26/2019   Recent Labs  Lab 10/26/19 1710 10/26/19 1957 10/27/19 0725 10/27/19 1114  GLUCAP 220* 254* 222* 298*   HTN -Home meds include Norvasc, benazepril, metoprolol and HCTZ.   -HCTZ on hold.  Others to continue.   -Continue to monitor blood pressure  Impaired mobility/falls -Patient states he uses walker/cane at home.  Also reported a recent fall, likely secondary to generalized weakness from infection.  PT eval ordered.     Mobility: PT eval. Code Status:   Code Status: Full Code  Nutritional status: Body mass index is 36.61 kg/m.     Diet Order            Diet heart healthy/carb modified Room service appropriate? Yes; Fluid consistency: Thin  Diet effective now                 DVT prophylaxis: enoxaparin (LOVENOX) injection 40 mg Start: 10/26/19 2200   Antimicrobials:  IV Rocephin Fluid: None Consultants: None Family Communication:  None at bedside  Status is: Inpatient  Remains inpatient appropriate because:Ongoing diagnostic testing needed not appropriate for outpatient work up and IV treatments  appropriate due to intensity of illness or inability to take PO   Dispo: The patient is from: Home              Anticipated d/c is to: Home likely              Anticipated d/c date is: 2 days              Patient currently is not medically stable to d/c.       Infusions:  . cefTRIAXone (ROCEPHIN)  IV    . magnesium sulfate bolus IVPB      Scheduled Meds: . amLODipine  10 mg Oral Daily  . atorvastatin  40  mg Oral Daily  . benazepril  40 mg Oral Q1500  . DULoxetine  60 mg Oral Daily  . enoxaparin (LOVENOX) injection  40 mg Subcutaneous Q24H  . gabapentin  600 mg Oral Q0600   And  . gabapentin  300 mg Oral BID  . insulin aspart  0-5 Units Subcutaneous QHS  . insulin aspart  0-9 Units Subcutaneous TID WC  . insulin aspart protamine- aspart  40 Units Subcutaneous BID WC  . metoprolol tartrate  50 mg Oral BID WC  . potassium chloride  40 mEq Oral Once    Antimicrobials: Anti-infectives (From admission, onward)   Start     Dose/Rate Route Frequency Ordered Stop   10/27/19 1600  cefTRIAXone (ROCEPHIN) 2 g in sodium chloride 0.9 % 100 mL IVPB        2 g 200 mL/hr over 30 Minutes Intravenous Every 24 hours 10/26/19 1951     10/26/19 1630  cefTRIAXone (ROCEPHIN) 1 g in sodium chloride 0.9 % 100 mL IVPB        1 g 200 mL/hr over 30 Minutes Intravenous  Once 10/26/19 1620 10/26/19 1849   10/26/19 1315  cefTRIAXone (ROCEPHIN) 1 g in sodium chloride 0.9 % 100 mL IVPB        1 g 200 mL/hr over 30 Minutes Intravenous  Once 10/26/19 1313 10/26/19 1417      PRN meds: acetaminophen **OR** acetaminophen, ondansetron **OR** ondansetron (ZOFRAN) IV, polyethylene glycol   Objective: Vitals:   10/27/19 0005 10/27/19 0400  BP: (!) 151/63 (!) 160/54  Pulse: 92 93  Resp: 20 20  Temp: 99.6 F (37.6 C) 99.8 F (37.7 C)  SpO2: 95% 96%    Intake/Output Summary (Last 24 hours) at 10/27/2019 1143 Last data filed at 10/27/2019 0901 Gross per 24 hour  Intake 2031.04 ml  Output 800 ml  Net 1231.04 ml   Filed Weights   10/26/19 1236  Weight: 99.8 kg   Weight change:  Body mass index is 36.61 kg/m.   Physical Exam: General exam: Appears calm and comfortable.  Not in physical distress Skin: No rashes, lesions or ulcers. HEENT: Atraumatic, normocephalic, supple neck, no obvious bleeding Lungs: Clear to auscultation bilaterally CVS: Regular rate and rhythm, no murmur GI/Abd soft, nontender,  nondistended, bowel sound present CNS: Alert, awake, oriented x3 Psychiatry: Mood appropriate Extremities: No edema, no calf tenderness  Data Review: I have personally reviewed the laboratory data and studies available.  Recent Labs  Lab 10/26/19 1312 10/27/19 0612  WBC 20.5* 19.3*  NEUTROABS 16.4*  --   HGB 13.2 11.8*  HCT 40.9 37.6*  MCV 88.9 90.6  PLT 214 200   Recent Labs  Lab 10/26/19 1312 10/26/19 1718 10/27/19 0612  NA 137  --  137  K 3.2*  --  3.7  CL 98  --  103  CO2 27  --  23  GLUCOSE 212*  --  237*  BUN 24*  --  16  CREATININE 1.45*  --  1.26*  CALCIUM 8.9  --  8.4*  MG  --  1.9  --     F/u labs ordered  Signed, Terrilee Croak, MD [facility] 10/27/2019

## 2019-10-28 LAB — CBC WITH DIFFERENTIAL/PLATELET
Abs Immature Granulocytes: 0.27 10*3/uL — ABNORMAL HIGH (ref 0.00–0.07)
Basophils Absolute: 0.1 10*3/uL (ref 0.0–0.1)
Basophils Relative: 0 %
Eosinophils Absolute: 0.2 10*3/uL (ref 0.0–0.5)
Eosinophils Relative: 1 %
HCT: 38.7 % — ABNORMAL LOW (ref 39.0–52.0)
Hemoglobin: 12 g/dL — ABNORMAL LOW (ref 13.0–17.0)
Immature Granulocytes: 2 %
Lymphocytes Relative: 13 %
Lymphs Abs: 1.9 10*3/uL (ref 0.7–4.0)
MCH: 28 pg (ref 26.0–34.0)
MCHC: 31 g/dL (ref 30.0–36.0)
MCV: 90.2 fL (ref 80.0–100.0)
Monocytes Absolute: 1.8 10*3/uL — ABNORMAL HIGH (ref 0.1–1.0)
Monocytes Relative: 13 %
Neutro Abs: 10.1 10*3/uL — ABNORMAL HIGH (ref 1.7–7.7)
Neutrophils Relative %: 71 %
Platelets: 198 10*3/uL (ref 150–400)
RBC: 4.29 MIL/uL (ref 4.22–5.81)
RDW: 15 % (ref 11.5–15.5)
WBC: 14.2 10*3/uL — ABNORMAL HIGH (ref 4.0–10.5)
nRBC: 0 % (ref 0.0–0.2)

## 2019-10-28 LAB — BASIC METABOLIC PANEL
Anion gap: 9 (ref 5–15)
BUN: 21 mg/dL (ref 8–23)
CO2: 24 mmol/L (ref 22–32)
Calcium: 8.6 mg/dL — ABNORMAL LOW (ref 8.9–10.3)
Chloride: 103 mmol/L (ref 98–111)
Creatinine, Ser: 1.39 mg/dL — ABNORMAL HIGH (ref 0.61–1.24)
GFR calc Af Amer: 57 mL/min — ABNORMAL LOW (ref >60)
GFR calc non Af Amer: 49 mL/min — ABNORMAL LOW (ref >60)
Glucose, Bld: 168 mg/dL — ABNORMAL HIGH (ref 70–99)
Potassium: 4 mmol/L (ref 3.5–5.1)
Sodium: 136 mmol/L (ref 135–145)

## 2019-10-28 LAB — GLUCOSE, CAPILLARY
Glucose-Capillary: 171 mg/dL — ABNORMAL HIGH (ref 70–99)
Glucose-Capillary: 250 mg/dL — ABNORMAL HIGH (ref 70–99)
Glucose-Capillary: 252 mg/dL — ABNORMAL HIGH (ref 70–99)
Glucose-Capillary: 216 mg/dL — ABNORMAL HIGH (ref 70–99)

## 2019-10-28 LAB — PHOSPHORUS: Phosphorus: 2 mg/dL — ABNORMAL LOW (ref 2.5–4.6)

## 2019-10-28 LAB — MAGNESIUM: Magnesium: 2 mg/dL (ref 1.7–2.4)

## 2019-10-28 MED ORDER — INSULIN ASPART PROT & ASPART (70-30 MIX) 100 UNIT/ML ~~LOC~~ SUSP
45.0000 [IU] | Freq: Two times a day (BID) | SUBCUTANEOUS | Status: DC
  Administered 2019-10-28 – 2019-10-29 (×2): 45 [IU] via SUBCUTANEOUS
  Filled 2019-10-28: qty 10

## 2019-10-28 MED ORDER — SODIUM CHLORIDE 0.9 % IV SOLN: INTRAVENOUS | Status: DC

## 2019-10-28 NOTE — Progress Notes (Signed)
Physical Therapy Treatment Patient Details Name: Alec Larson MRN: [id number] DOB: [birth year] Today's Date: 10/28/2019    History of Present Illness 73 y.o. male with medical history significant for DM, HTN, BPH. Presented to the Ed with complaints of pain with urination that started 3- 4 days ago, and increased urinary frequency. Girlfriend present at bedside reports that symptoms have gotten worse over the past 2 days and that this morning patient was confused- he watches Youtube a lot but today he couldnt seem to navigate in the app, or use the remote control and he wasn't able to talk as much.  Patient has been vaccinated for [place]. ED Course: Tmax- 101.1, blood pressure 259 - 563 systolic, WBC- 87.5, K- 3.2, Cr 1.45.  UA- many bacteria, small leuks, > 50 WBCs. Chest - Clear. Head Ct without acute abnormality.  EKG- sinus Without acute abnormalities.  IV ceftriazone given. Hospitalist to admit for UTI.    PT Comments    Pt tolerates standing marching with BUE support on RW and min A from therapist to steady pt due to increased lateral weight-shifting in stance. Pt with heavy posterior weight and BLE braced on bed with initial STS reps, fair improvement with repeated reps. Pt requires assistance while ambulating to maintain body at safe distance from RW and to maneuver safely in hallway around objects and with turns. Pt drifts R/L in straight line gait despite cues and assist from therapist. Pt jokingly pretends to run and sit while in hallway, unsure if gait pattern is pt's attempt to joke because unable to cue pt to improve gait.  Pt with occasional mumbling speech difficult to understand, but pleasant throughout session requiring increased time to perform activities. Pt on RA with SpO2 100%. Pt tolerates remaining sitting up in chair with 2 nursing students in room at [facility]. Patient will benefit from continued physical therapy in hospital and recommendations below to increase strength,  balance, endurance for safe ADLs and gait.   Follow Up Recommendations  Home health PT;Supervision for mobility/OOB     Equipment Recommendations  None recommended by PT    Recommendations for Other Services       Precautions / Restrictions Precautions Precautions: Fall Restrictions Weight Bearing Restrictions: No    Mobility  Bed Mobility Overal bed mobility: Needs Assistance Bed Mobility: Supine to Sit  Supine to sit: Min guard;HOB elevated  General bed mobility comments: slow, increased time with use of elevated HOB and bedrail, difficulty sequencing requiring cues for hand placement and LE mobility to come to sitting EOB  Transfers Overall transfer level: Needs assistance Equipment used: Rolling walker (2 wheeled) Transfers: Sit to/from Stand Sit to Stand: Min guard  General transfer comment: heavy posterior weight shift through heels with toes elevated off floor, BLE braced on bed, constant cues for hand placement to improve safety  Ambulation/Gait Ambulation/Gait assistance: Min guard;Min assist Gait Distance (Feet): 120 Feet Assistive device: Rolling walker (2 wheeled) Gait Pattern/deviations: Step-through pattern;Drifts right/left Gait velocity: decreased   General Gait Details: drifting R/L in hallway with RW, requiring frequent repositioning within RW frame to maintain at appropriate distance and for maneuvering safely, increased time with turns requiring cues for attention to task   Stairs             Wheelchair Mobility    Modified Rankin (Stroke Patients Only)       Balance Overall balance assessment: Needs assistance;History of Falls Sitting-balance support: Feet supported;No upper extremity supported Sitting balance-Leahy Scale: Fair Sitting balance -  Comments: seated EOB   Standing balance support: Bilateral upper extremity supported;During functional activity Standing balance-Leahy Scale: Poor Standing balance comment: reliant on RW to  steady       Cognition Arousal/Alertness: Awake/alert Behavior During Therapy: WFL for tasks assessed/performed Overall Cognitive Status: No family/caregiver present to determine baseline cognitive functioning  General Comments: pt pleasantly confused, occasional mumbling conversation, increased time to follow one step commands      Exercises General Exercises - Lower Extremity Hip Flexion/Marching: AROM;Strengthening;Both;10 reps;Standing (RW for steadying) Other Exercises Other Exercises: STS 5 reps, BUE assisting to power up    General Comments        Pertinent Vitals/Pain Pain Assessment: No/denies pain    Home Living                      Prior Function            PT Goals (current goals can now be found in the care plan section) Acute Rehab PT Goals Patient Stated Goal: go home PT Goal Formulation: With patient Time For Goal Achievement: 11/03/19 Potential to Achieve Goals: Good Progress towards PT goals: Progressing toward goals    Frequency    Min 3X/week      PT Plan Current plan remains appropriate    Co-evaluation              AM-PAC PT "6 Clicks" Mobility   Outcome Measure  Help needed turning from your back to your side while in a flat bed without using bedrails?: None Help needed moving from lying on your back to sitting on the side of a flat bed without using bedrails?: None Help needed moving to and from a bed to a chair (including a wheelchair)?: A Little Help needed standing up from a chair using your arms (e.g., wheelchair or bedside chair)?: A Little Help needed to walk in hospital room?: A Little Help needed climbing 3-5 steps with a railing? : A Little 6 Click Score: 20    End of Session Equipment Utilized During Treatment: Gait belt Activity Tolerance: Patient tolerated treatment well Patient left: in chair;with call bell/phone within reach;with nursing/sitter in room Nurse Communication: Mobility status PT Visit  Diagnosis: Unsteadiness on feet (R26.81);Other abnormalities of gait and mobility (R26.89);Muscle weakness (generalized) (M62.81);History of falling (Z91.81);Repeated falls (R29.6)     Time: 0355-9741 PT Time Calculation (min) (ACUTE ONLY): 25 min  Charges:  $Gait Training: 8-22 mins $Therapeutic Exercise: 8-22 mins                      Tori Consuela Widener PT, DPT 10/28/19, 2:06 PM [phone]

## 2019-10-28 NOTE — Progress Notes (Signed)
PROGRESS NOTE  [name]  DOB: Mar 29, 1945  PCP: Alfonso Ramus, MD EHO:[id number]  DOA: 10/26/2019  LOS: 2 days   Chief Complaint  Patient presents with  . Fever    Brief narrative: Alec Larson is a 74 y.o. male with medical history significant for  DM, HTN, BPH who lives at home with his girlfriend and gets around with a walker/cane. Patient presented to the ED on 9/14 with reported history of progressively worsening dysuria, urinary frequency, altered mental status and generalized weakness.  He also reports he had a fall sustaining some bruising to the hip. Patient has been vaccinated for [place].  In the ED, patient had a fever up to 101.1, WC count elevated to 20.5, creatinine elevated to 1.45. Urinalysis with many bacteria, small leukocytes. CT head normal. Patient was admitted to hospitalist service for sepsis secondary to UTI.  Subjective: Patient was seen and examined this morning.  Propped up in bed.  Not in distress.  Feels better. Reports an episode of loose motion yesterday afternoon.  Assessment/Plan: Sepsis-present on admission Klebsiella UTI -Presented with dysuria, frequency, fever, leukocytosis and urinalysis suggestive of UTI. -Managed per sepsis protocol with IV antibiotics, IV fluid.   -Urine culture is growing more than 100,000 CFU per mL of Klebsiella oxytoca.  Pending sensitivity. -Currently on IV Rocephin.  Continue the same. -No recurrence of fever.  WBC count slowly weaning down as below. -Because of elevated creatinine and persistent diarrhea, resume IV fluid today with normal saline at 75 mill per hour. Recent Labs  Lab 10/26/19 1312 10/26/19 1718 10/27/19 0612 10/28/19 0615  WBC 20.5*  --  19.3* 14.2*  LATICACIDVEN  --  1.3  --   --    Diarrhea -Patient reports chronic diarrhea with Metformin.  Had one bad episode yesterday.   -Start on PRN  Imodium.  Continue to monitor.  Acute metabolic encephalopathy -Likely secondary  to sepsis. -CT head normal. -Mental status improving.  Continue to monitor. -Continue cymbalta, gabapentin.  CKD3a  -Creatinine roughly at baseline. Recent Labs    01/15/19 1117 10/26/19 1312 10/27/19 0612 10/28/19 0615  BUN 16 24* 16 21  CREATININE 1.25* 1.45* 1.26* 1.39*   Hypokalemia/hypomagnesemia/hypophosphatemia -Electrolyte levels are low likely secondary to diuretics and diarrhea -Phosphorus level low at 2 this morning.  Replacement ordered. -Recheck in a.m. Recent Labs  Lab 10/26/19 1312 10/26/19 1718 10/27/19 0612 10/28/19 0615  K 3.2*  --  3.7 4.0  MG  --  1.9  --  2.0  PHOS  --   --   --  2.0*   Diabetes mellitus type 2 -Home meds include insulin 70/30, 45 units in a.m. and 70 units in p.m. along with Januvia and Amaryl. -He was resumed on 70/30 insulin 40 units twice daily.  Oral meds on hold. -Sliding scale insulin with Accu-Cheks -DM Neurontin for neuropathy.  Lab Results  Component Value Date   HGBA1C 8.0 (H) 10/26/2019   Recent Labs  Lab 10/27/19 0725 10/27/19 1114 10/27/19 1626 10/27/19 2010 10/28/19 0715  GLUCAP 222* 298* 155* 180* 171*   HTN -Home meds include Norvasc, benazepril, metoprolol and HCTZ.   -HCTZ on hold.  Continue others. -Continue to monitor blood pressure  Impaired mobility/falls -Patient states he uses walker/cane at home.  Also reported a recent fall, likely secondary to generalized weakness from infection.  PT eval ordered.     Mobility: PT eval obtained.  Home health PT recommended. Code Status:   Code Status: Full Code  Nutritional status: Body mass index is 36.61 kg/m.     Diet Order            Diet heart healthy/carb modified Room service appropriate? Yes; Fluid consistency: Thin  Diet effective now                 DVT prophylaxis: enoxaparin (LOVENOX) injection 40 mg Start: 10/26/19 2200   Antimicrobials:  IV Rocephin Fluid: None Consultants: None Family Communication:  None at  bedside  Status is: Inpatient  Remains inpatient appropriate because:Ongoing diagnostic testing needed not appropriate for outpatient work up and IV treatments appropriate due to intensity of illness or inability to take PO   Dispo: The patient is from: Home              Anticipated d/c is to: Home likely              Anticipated d/c date is: 2 days              Patient currently is not medically stable to d/c.       Infusions:  . cefTRIAXone (ROCEPHIN)  IV 2 g (10/27/19 1705)    Scheduled Meds: . amLODipine  10 mg Oral Daily  . atorvastatin  40 mg Oral Daily  . benazepril  40 mg Oral Q1500  . DULoxetine  60 mg Oral Daily  . enoxaparin (LOVENOX) injection  40 mg Subcutaneous Q24H  . gabapentin  600 mg Oral Q0600   And  . gabapentin  300 mg Oral BID  . insulin aspart  0-5 Units Subcutaneous QHS  . insulin aspart  0-9 Units Subcutaneous TID WC  . insulin aspart protamine- aspart  40 Units Subcutaneous BID WC  . metoprolol tartrate  50 mg Oral BID WC    Antimicrobials: Anti-infectives (From admission, onward)   Start     Dose/Rate Route Frequency Ordered Stop   10/27/19 1600  cefTRIAXone (ROCEPHIN) 2 g in sodium chloride 0.9 % 100 mL IVPB        2 g 200 mL/hr over 30 Minutes Intravenous Every 24 hours 10/26/19 1951     10/26/19 1630  cefTRIAXone (ROCEPHIN) 1 g in sodium chloride 0.9 % 100 mL IVPB        1 g 200 mL/hr over 30 Minutes Intravenous  Once 10/26/19 1620 10/26/19 1849   10/26/19 1315  cefTRIAXone (ROCEPHIN) 1 g in sodium chloride 0.9 % 100 mL IVPB        1 g 200 mL/hr over 30 Minutes Intravenous  Once 10/26/19 1313 10/26/19 1417      PRN meds: acetaminophen **OR** acetaminophen, ondansetron **OR** ondansetron (ZOFRAN) IV, polyethylene glycol   Objective: Vitals:   10/27/19 2009 10/28/19 0413  BP: (!) 138/57 (!) 153/59  Pulse: 69 80  Resp: 20 20  Temp:  98.8 F (37.1 C)  SpO2: 97% 100%    Intake/Output Summary (Last 24 hours) at 10/28/2019  0826 Last data filed at 10/28/2019 0300 Gross per 24 hour  Intake 820 ml  Output --  Net 820 ml   Filed Weights   10/26/19 1236  Weight: 99.8 kg   Weight change:  Body mass index is 36.61 kg/m.   Physical Exam: General exam: Appears calm and comfortable.  Not in physical distress.  Continues to feel weak there Skin: No rashes, lesions or ulcers. HEENT: Atraumatic, normocephalic, supple neck, no obvious bleeding Lungs: Clear to auscultation bilaterally CVS: Regular rate and rhythm, no murmur GI/Abd soft, nontender, nondistended, bowel  sound present CNS: Alert, awake, oriented x3 Psychiatry: Mood appropriate Extremities: No edema, no calf tenderness  Data Review: I have personally reviewed the laboratory data and studies available.  Recent Labs  Lab 10/26/19 1312 10/27/19 0612 10/28/19 0615  WBC 20.5* 19.3* 14.2*  NEUTROABS 16.4*  --  10.1*  HGB 13.2 11.8* 12.0*  HCT 40.9 37.6* 38.7*  MCV 88.9 90.6 90.2  PLT 214 200 198   Recent Labs  Lab 10/26/19 1312 10/26/19 1718 10/27/19 0612 10/28/19 0615  NA 137  --  137 136  K 3.2*  --  3.7 4.0  CL 98  --  103 103  CO2 27  --  23 24  GLUCOSE 212*  --  237* 168*  BUN 24*  --  16 21  CREATININE 1.45*  --  1.26* 1.39*  CALCIUM 8.9  --  8.4* 8.6*  MG  --  1.9  --  2.0  PHOS  --   --   --  2.0*    F/u labs ordered   Signed, Terrilee Croak, MD [facility] 10/28/2019

## 2019-10-29 DIAGNOSIS — N390 Urinary tract infection, site not specified: Secondary | ICD-10-CM | POA: Diagnosis present

## 2019-10-29 DIAGNOSIS — B9689 Other specified bacterial agents as the cause of diseases classified elsewhere: Secondary | ICD-10-CM | POA: Diagnosis present

## 2019-10-29 LAB — BASIC METABOLIC PANEL
Anion gap: 11 (ref 5–15)
BUN: 21 mg/dL (ref 8–23)
CO2: 23 mmol/L (ref 22–32)
Calcium: 8.5 mg/dL — ABNORMAL LOW (ref 8.9–10.3)
Chloride: 103 mmol/L (ref 98–111)
Creatinine, Ser: 1.26 mg/dL — ABNORMAL HIGH (ref 0.61–1.24)
GFR calc Af Amer: >60 mL/min (ref >60)
GFR calc non Af Amer: 55 mL/min — ABNORMAL LOW (ref >60)
Glucose, Bld: 192 mg/dL — ABNORMAL HIGH (ref 70–99)
Potassium: 3.5 mmol/L (ref 3.5–5.1)
Sodium: 137 mmol/L (ref 135–145)

## 2019-10-29 LAB — CBC WITH DIFFERENTIAL/PLATELET
Abs Immature Granulocytes: 0.26 10*3/uL — ABNORMAL HIGH (ref 0.00–0.07)
Basophils Absolute: 0.1 10*3/uL (ref 0.0–0.1)
Basophils Relative: 1 %
Eosinophils Absolute: 0.2 10*3/uL (ref 0.0–0.5)
Eosinophils Relative: 2 %
HCT: 39.5 % (ref 39.0–52.0)
Hemoglobin: 12.5 g/dL — ABNORMAL LOW (ref 13.0–17.0)
Immature Granulocytes: 3 %
Lymphocytes Relative: 16 %
Lymphs Abs: 1.6 10*3/uL (ref 0.7–4.0)
MCH: 28.2 pg (ref 26.0–34.0)
MCHC: 31.6 g/dL (ref 30.0–36.0)
MCV: 89 fL (ref 80.0–100.0)
Monocytes Absolute: 1.6 10*3/uL — ABNORMAL HIGH (ref 0.1–1.0)
Monocytes Relative: 16 %
Neutro Abs: 6.4 10*3/uL (ref 1.7–7.7)
Neutrophils Relative %: 62 %
Platelets: 231 10*3/uL (ref 150–400)
RBC: 4.44 MIL/uL (ref 4.22–5.81)
RDW: 14.6 % (ref 11.5–15.5)
WBC: 10.1 10*3/uL (ref 4.0–10.5)
nRBC: 0 % (ref 0.0–0.2)

## 2019-10-29 LAB — URINE CULTURE: Culture: >=100000 — AB

## 2019-10-29 LAB — GLUCOSE, CAPILLARY
Glucose-Capillary: 170 mg/dL — ABNORMAL HIGH (ref 70–99)
Glucose-Capillary: 302 mg/dL — ABNORMAL HIGH (ref 70–99)

## 2019-10-29 LAB — PHOSPHORUS: Phosphorus: 2.9 mg/dL (ref 2.5–4.6)

## 2019-10-29 MED ORDER — CEPHALEXIN 500 MG PO CAPS: 500.0000 mg | ORAL_CAPSULE | Freq: Three times a day (TID) | ORAL | 0 refills | Status: AC

## 2019-10-29 MED ORDER — CEPHALEXIN 500 MG PO CAPS: 500.0000 mg | ORAL_CAPSULE | Freq: Three times a day (TID) | ORAL | Status: DC

## 2019-10-29 MED ORDER — SACCHAROMYCES BOULARDII 250 MG PO CAPS: 250.0000 mg | ORAL_CAPSULE | Freq: Two times a day (BID) | ORAL | 0 refills | Status: AC

## 2019-10-29 NOTE — Plan of Care (Signed)

## 2019-10-29 NOTE — Discharge Summary (Signed)
Physician Discharge Summary  Alec Larson DPO:[id number] DOB: [birth year] DOA: 10/26/2019  PCP: Alfonso Ramus, MD  Admit date: 10/26/2019 Discharge date: 10/29/2019  Admitted From: Home Discharge disposition: Home with home health PT, RN   Code Status: Full Code  Diet Recommendation: Cardiac/diabetic diet  Discharge Diagnosis:   Principal Problem:   Sepsis ([place]) Active Problems:   UTI due to Klebsiella species   HTN (hypertension)   DM (diabetes mellitus) ([place])  History of Present Illness / Brief narrative:  Alec Larson a 74 y.o.malewith medical history significant forDM, HTN, BPH who lives at home with his girlfriend and gets around with a walker/cane. Patient presented to the ED on 9/14 with reported history of progressively worsening dysuria, urinary frequency, altered mental status and generalized weakness.  He also reports he had a fall sustaining some bruising to the hip. Patient has been vaccinated for [place].  In the ED, patient had a fever up to 101.1, WC count elevated to 20.5, creatinine elevated to 1.45. Urinalysis with many bacteria, small leukocytes. CT head normal. Patient was admitted to hospitalist service for sepsis secondary to UTI.  Subjective:  Seen and examined this morning.  Pleasant elderly Caucasian male.  Lying on bed.  Not in distress.  Feels ready to go home.  Hospital Course:  Sepsis-present on admission Klebsiella UTI -Presented with dysuria, frequency, fever, leukocytosis and urinalysis suggestive of UTI. -Managed per sepsis protocol with IV antibiotics, IV fluid.   -Urine culture reviewed more than 100,000 CFU per mL of Klebsiella oxytoca.  -Currently on IV Rocephin.   -No recurrence of fever.  WBC count slowly weaning down as below. -Will discharge on oral Keflex for next 5 days with probiotics.  Recent Labs  Lab 10/26/19 1312 10/26/19 1718 10/27/19 0612 10/28/19 0615 10/29/19 0815  WBC 20.5*  --  19.3* 14.2*  10.1  LATICACIDVEN  --  1.3  --   --   --    Diarrhea -Patient reports chronic diarrhea with Metformin.  As needed Imodium.  Acute metabolic encephalopathy -Likely secondary to sepsis. -CT head normal. -Mental status is now back to baseline -Continue cymbalta, gabapentin.  CKD3a  -Creatinine roughly at baseline.  Recent Labs    01/15/19 1117 10/26/19 1312 10/27/19 0612 10/28/19 0615 10/29/19 0815  BUN 16 24* 16 21 21   CREATININE 1.25* 1.45* 1.26* 1.39* 1.26*   Hypokalemia/hypomagnesemia/hypophosphatemia -Electrolyte levels were low likely secondary to diuretics and diarrhea -Replaced accordingly.  Levels normalized. Recent Labs  Lab 10/26/19 1312 10/26/19 1718 10/27/19 0612 10/28/19 0615 10/29/19 0815  K 3.2*  --  3.7 4.0 3.5  MG  --  1.9  --  2.0  --   PHOS  --   --   --  2.0* 2.9   Diabetes mellitus type 2  -A1c 8 on 9/14. (Adjusted target 8 or less for his age) -Home meds include insulin 70/30, 45 units in a.m. and 70 units in p.m. along with Januvia and Amaryl. -Resume the same regimen post discharge. -DM Neurontin for neuropathy. Recent Labs  Lab 10/28/19 0715 10/28/19 1116 10/28/19 1605 10/28/19 2102 10/29/19 0731  GLUCAP 171* 250* 252* 216* 170*   HTN -Home meds include Norvasc, benazepril, metoprolol and HCTZ.   -Continue the same post discharge  Impaired mobility/falls -Patient states he uses walker/cane at home.  Also reported a recent fall, likely secondary to generalized weakness from infection.  PT eval obtained. -Home health PT recommended.   Wound care:    Discharge Exam:  Vitals:   10/28/19 1430 10/28/19 2106 10/28/19 2128 10/29/19 0618  BP: (!) 150/69 (!) 164/66  (!) 167/67  Pulse: 76 69  80  Resp: (!) 21   18  Temp: 98.9 F (37.2 C) 99 F (37.2 C)  98.5 F (36.9 C)  TempSrc: Oral Oral    SpO2: 98% 98% 93% 96%  Weight:      Height:        Body mass index is 36.61 kg/m.  General exam: Appears calm and comfortable.   Not in physical distress Skin: No rashes, lesions or ulcers. HEENT: Atraumatic, normocephalic, supple neck, no obvious bleeding Lungs: Clear to auscultation bilaterally CVS: Regular rate and rhythm, no murmur GI/Abd soft, nontender, nondistended no bowel sound present CNS: Alert, awake, oriented x3 Psychiatry: Mood appropriate Extremities: No edema, no calf tenderness  Follow ups:   Discharge Instructions    Diet - low sodium heart healthy   Complete by: As directed    Diet Carb Modified   Complete by: As directed    Increase activity slowly   Complete by: As directed       Follow-up Information    [facility] Follow up.   Specialty: Home Health Services Why: Will call you to schedule home health visit.  Contact information: [place] [address] [phone]        Alfonso Ramus, MD Follow up.   Specialty: Family Medicine Contact information: [address] [place] [address] [phone]               Recommendations for Outpatient Follow-Up:   1. Follow-up with PCP as an outpatient  Discharge Instructions:  Follow with Primary MD Elie Goody Areta Haber, MD in 7 days   Get CBC/BMP checked in next visit within 1 week by PCP or SNF MD ( we routinely change or add medications that can affect your baseline labs and fluid status, therefore we recommend that you get the mentioned basic workup next visit with your PCP, your PCP may decide not to get them or add new tests based on their clinical decision)  On your next visit with your PCP, please Get Medicines reviewed and adjusted.  Please request your PCP  to go over all Hospital Tests and Procedure/Radiological results at the follow up, please get all Hospital records sent to your Prim MD by signing hospital release before you go home.  Activity: As tolerated with Full fall precautions use walker/cane & assistance as needed  For Heart failure  patients - Check your Weight same time everyday, if you gain over 2 pounds, or you develop in leg swelling, experience more shortness of breath or chest pain, call your Primary MD immediately. Follow Cardiac Low Salt Diet and 1.5 lit/day fluid restriction.  If you have smoked or chewed Tobacco in the last 2 yrs please stop smoking, stop any regular Alcohol  and or any Recreational drug use.  If you experience worsening of your admission symptoms, develop shortness of breath, life threatening emergency, suicidal or homicidal thoughts you must seek medical attention immediately by calling 911 or calling your MD immediately  if symptoms less severe.  You Must read complete instructions/literature along with all the possible adverse reactions/side effects for all the Medicines you take and that have been prescribed to you. Take any new Medicines after you have completely understood and accpet all the possible adverse reactions/side effects.   Do not drive, operate heavy machinery, perform activities at  heights, swimming or participation in water activities or provide baby sitting services if your were admitted for syncope or siezures until you have seen by Primary MD or a Neurologist and advised to do so again.  Do not drive when taking Pain medications.  Do not take more than prescribed Pain, Sleep and Anxiety Medications  Wear Seat belts while driving.   Please note You were cared for by a hospitalist during your hospital stay. If you have any questions about your discharge medications or the care you received while you were in the hospital after you are discharged, you can call the unit and asked to speak with the hospitalist on call if the hospitalist that took care of you is not available. Once you are discharged, your primary care physician will handle any further medical issues. Please note that NO REFILLS for any discharge medications will be authorized once you are discharged, as it is  imperative that you return to your primary care physician (or establish a relationship with a primary care physician if you do not have one) for your aftercare needs so that they can reassess your need for medications and monitor your lab values.    Allergies as of 10/29/2019      Reactions   Aspirin Anaphylaxis, Hives   Penicillins Hives   Did it involve swelling of the face/tongue/throat, SOB, or low BP? No Did it involve sudden or severe rash/hives, skin peeling, or any reaction on the inside of your mouth or nose? No Did you need to seek medical attention at a hospital or doctor's office?Never had a reaction When did it last happen? Never had a reaction to this medication. If all above answers are "NO", may proceed with cephalosporin use.   Bee Venom Hives      Medication List    TAKE these medications   amLODipine 10 MG tablet Commonly known as: NORVASC Take 10 mg by mouth daily.   atorvastatin 40 MG tablet Commonly known as: LIPITOR Take 40 mg by mouth daily.   azelastine 0.05 % ophthalmic solution Commonly known as: OPTIVAR Place 1 drop into both eyes 2 (two) times daily as needed (itching/swelling eyes. (allergies)).   benazepril 40 MG tablet Commonly known as: LOTENSIN Take 40 mg by mouth daily in the afternoon.   cephALEXin 500 MG capsule Commonly known as: KEFLEX Take 1 capsule (500 mg total) by mouth 3 (three) times daily for 5 days.   DULoxetine 60 MG capsule Commonly known as: CYMBALTA Take 60 mg by mouth daily.   fluticasone 50 MCG/ACT nasal spray Commonly known as: FLONASE Place 2 sprays into both nostrils daily as needed (allergies.).   gabapentin 300 MG capsule Commonly known as: NEURONTIN Take 300-600 mg by mouth See admin instructions. Take 2 capsules (600 mg) in the morning, 1 capsule (300 mg) by mouth at noon, & 1 capsule (300 mg) by mouth at night   glimepiride 4 MG tablet Commonly known as: AMARYL Take 4 mg by mouth daily with breakfast.     hydrochlorothiazide 25 MG tablet Commonly known as: HYDRODIURIL Take 25 mg by mouth daily.   loperamide 2 MG capsule Commonly known as: IMODIUM Take 1 capsule (2 mg total) by mouth daily with breakfast. What changed:   when to take this  reasons to take this   metoprolol tartrate 50 MG tablet Commonly known as: LOPRESSOR Take 50 mg by mouth 2 (two) times daily with a meal.   NovoLIN 70/30 (70-30) 100 UNIT/ML injection Generic  drug: insulin NPH-regular Human Inject 40-60 Units into the skin See admin instructions. Inject 40 units into the skin in the morning and 60 units at night   omeprazole 20 MG capsule Commonly known as: PRILOSEC Take 20 mg by mouth daily before breakfast.   oxyCODONE-acetaminophen 10-325 MG tablet Commonly known as: PERCOCET Take 1 tablet by mouth every 6 (six) hours as needed for pain.   saccharomyces boulardii 250 MG capsule Commonly known as: FLORASTOR Take 1 capsule (250 mg total) by mouth 2 (two) times daily for 5 days.   sitaGLIPtin 50 MG tablet Commonly known as: JANUVIA Take 50 mg by mouth daily.   VITAMIN C PO Take 1 tablet by mouth daily.       Time coordinating discharge: 35 minutes  The results of significant diagnostics from this hospitalization (including imaging, microbiology, ancillary and laboratory) are listed below for reference.    Procedures and Diagnostic Studies:   CT Head Wo Contrast  Result Date: 10/26/2019 CLINICAL DATA:  Mental status change, unknown cause. Additional history provided: Recent UTI with altered mental status for 2-3 days. EXAM: CT HEAD WITHOUT CONTRAST TECHNIQUE: Contiguous axial images were obtained from the base of the skull through the vertex without intravenous contrast. COMPARISON:  Head CT 08/08/2019. FINDINGS: Brain: Stable, mild generalized parenchymal atrophy. 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 Stable background moderate  ill-defined hypoattenuation within the cerebral white matter which is nonspecific, but consistent with chronic small vessel ischemic disease. There is no acute intracranial hemorrhage. No demarcated cortical infarct is identified. No extra-axial fluid collection. No evidence of intracranial mass. No midline shift. Vascular: No hyperdense vessel.  Atherosclerotic calcifications Skull: Normal. Negative for fracture or focal lesion. Sinuses/Orbits: Visualized orbits show no acute finding. Small osteoma within the inferior right frontal sinus. Moderate ethmoid sinus mucosal thickening. No significant mastoid effusion IMPRESSION: No CT evidence of acute intracranial abnormality. 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 Stable background mild generalized parenchymal atrophy and moderate cerebral white matter chronic small vessel ischemic disease. Moderate ethmoid sinus mucosal thickening. Electronically Signed   By: Kellie Simmering DO   On: 10/26/2019 15:05   DG Chest Port 1 View  Result Date: 10/26/2019 CLINICAL DATA:  Shortness of breath and confusion. EXAM: PORTABLE CHEST 1 VIEW COMPARISON:  CT of the chest on 08/08/2019 at [place]: The heart size and mediastinal contours are within normal limits. There is no evidence of pulmonary edema, consolidation, pneumothorax or pleural fluid. The visualized skeletal structures are unremarkable. IMPRESSION: No active disease. Electronically Signed   By: Aletta Edouard M.D.   On: 10/26/2019 14:14     Labs:   Basic Metabolic Panel: Recent Labs  Lab 10/26/19 1312 10/26/19 1312 10/26/19 1718 10/27/19 0612 10/27/19 0612 10/28/19 0615 10/29/19 0815  NA 137  --   --  137  --  136 137  K 3.2*   < >  --  3.7   < > 4.0 3.5  CL 98  --   --  103  --  103 103  CO2 27  --   --  23  --  24 23  GLUCOSE 212*  --   --  237*  --  168* 192*  BUN 24*  --   --  16  --  21 21  CREATININE 1.45*  --    --  1.26*  --  1.39* 1.26*  CALCIUM  8.9  --   --  8.4*  --  8.6* 8.5*  MG  --   --  1.9  --   --  2.0  --   PHOS  --   --   --   --   --  2.0* 2.9   < > = values in this interval not displayed.   GFR Estimated Creatinine Clearance: 55 mL/min (A) (by C-G formula based on SCr of 1.26 mg/dL (H)). Liver Function Tests: Recent Labs  Lab 10/26/19 1312  AST 26  ALT 22  ALKPHOS 52  BILITOT 1.4*  PROT 7.5  ALBUMIN 3.9   No results for input(s): LIPASE, AMYLASE in the last 168 hours. No results for input(s): AMMONIA in the last 168 hours. Coagulation profile No results for input(s): INR, PROTIME in the last 168 hours.  CBC: Recent Labs  Lab 10/26/19 1312 10/27/19 0612 10/28/19 0615 10/29/19 0815  WBC 20.5* 19.3* 14.2* 10.1  NEUTROABS 16.4*  --  10.1* 6.4  HGB 13.2 11.8* 12.0* 12.5*  HCT 40.9 37.6* 38.7* 39.5  MCV 88.9 90.6 90.2 89.0  PLT 214 200 198 231   Cardiac Enzymes: No results for input(s): CKTOTAL, CKMB, CKMBINDEX, TROPONINI in the last 168 hours. BNP: Invalid input(s): POCBNP CBG: Recent Labs  Lab 10/28/19 0715 10/28/19 1116 10/28/19 1605 10/28/19 2102 10/29/19 0731  GLUCAP 171* 250* 252* 216* 170*   D-Dimer No results for input(s): DDIMER in the last 72 hours. Hgb A1c Recent Labs    10/26/19 1718  HGBA1C 8.0*   Lipid Profile No results for input(s): CHOL, HDL, LDLCALC, TRIG, CHOLHDL, LDLDIRECT in the last 72 hours. Thyroid function studies No results for input(s): TSH, T4TOTAL, T3FREE, THYROIDAB in the last 72 hours.  Invalid input(s): FREET3 Anemia work up No results for input(s): VITAMINB12, FOLATE, FERRITIN, TIBC, IRON, RETICCTPCT in the last 72 hours. Microbiology Recent Results (from the past 240 hour(s))  Microscopic Examination     Status: Abnormal   Collection Time: 10/25/19  3:13 PM   Urine  Result Value Ref Range Status   WBC, UA >30 (A) 0 - 5 /hpf Final   RBC 3-10 (A) 0 - 2 /hpf Final   Epithelial Cells (non renal) 0-10 0 - 10 /hpf  Final   Renal Epithel, UA None seen None seen /hpf Final   Bacteria, UA Moderate (A) None seen/Few Final   Yeast, UA Present None seen Final  SARS Coronavirus 2 by RT PCR (hospital order, performed in [place] hospital lab) Nasopharyngeal Nasopharyngeal Swab     Status: None   Collection Time: 10/26/19  1:14 PM   Specimen: Nasopharyngeal Swab  Result Value Ref Range Status   SARS Coronavirus 2 NEGATIVE NEGATIVE Final    Comment: (NOTE) SARS-CoV-2 target nucleic acids are NOT DETECTED.  The SARS-CoV-2 RNA is generally detectable in upper and lower respiratory specimens during the acute phase of infection. The lowest concentration of SARS-CoV-2 viral copies this assay can detect is 250 copies / mL. A negative result does not preclude SARS-CoV-2 infection and should not be used as the sole basis for treatment or other patient management decisions.  A negative result may occur with improper specimen collection / handling, submission of specimen other than nasopharyngeal swab, presence of viral mutation(s) within the areas targeted by this assay, and inadequate number of viral copies (<250 copies / mL). A negative result must be combined with clinical observations, patient history, and epidemiological information.  Fact Sheet for Patients:  StrictlyIdeas.no  Fact Sheet for Healthcare Providers: BankingDealers.co.za  This test is not yet approved or  cleared by the [place] FDA and has been authorized for detection and/or diagnosis of SARS-CoV-2 by FDA under an Emergency Use Authorization (EUA).  This EUA will remain in effect (meaning this test can be used) for the duration of the COVID-19 declaration under Section 564(b)(1) of the Act, 21 U.S.C. section 360bbb-3(b)(1), unless the authorization is terminated or revoked sooner.  Performed at [facility], [address], [place], [place] [address]   Culture, Urine     Status:  Abnormal   Collection Time: 10/26/19  3:51 PM   Specimen: Urine, Clean Catch  Result Value Ref Range Status   Specimen Description   Final    URINE, CLEAN CATCH Performed at [facility], [address], [place], [place] [address]    Special Requests   Final    NONE Performed at [facility], [address] [place], [place] [address]    Culture >=100,000 COLONIES/mL KLEBSIELLA OXYTOCA (A)  Final   Report Status 10/29/2019 FINAL  Final   Organism ID, Bacteria KLEBSIELLA OXYTOCA (A)  Final      Susceptibility   Klebsiella oxytoca - MIC*    AMPICILLIN RESISTANT Resistant     CEFAZOLIN <=4 SENSITIVE Sensitive     CEFTRIAXONE <=0.25 SENSITIVE Sensitive     CIPROFLOXACIN <=0.25 SENSITIVE Sensitive     GENTAMICIN <=1 SENSITIVE Sensitive     IMIPENEM 0.5 SENSITIVE Sensitive     NITROFURANTOIN 32 SENSITIVE Sensitive     TRIMETH/SULFA <=20 SENSITIVE Sensitive     AMPICILLIN/SULBACTAM <=2 SENSITIVE Sensitive     PIP/TAZO <=4 SENSITIVE Sensitive     * >=100,000 COLONIES/mL KLEBSIELLA OXYTOCA  Culture, blood (routine x 2)     Status: None (Preliminary result)   Collection Time: 10/26/19  5:19 PM   Specimen: Left Antecubital; Blood  Result Value Ref Range Status   Specimen Description LEFT ANTECUBITAL  Final   Special Requests   Final    BOTTLES DRAWN AEROBIC AND ANAEROBIC Blood Culture adequate volume   Culture   Final    NO GROWTH 3 DAYS Performed at [facility], [address] [place], [place] [address]    Report Status PENDING  Incomplete  Culture, blood (routine x 2)     Status: None (Preliminary result)   Collection Time: 10/26/19  5:19 PM   Specimen: BLOOD LEFT ARM  Result Value Ref Range Status   Specimen Description BLOOD LEFT ARM  Final   Special Requests   Final    BOTTLES DRAWN AEROBIC AND ANAEROBIC Blood Culture adequate volume   Culture   Final    NO GROWTH 3 DAYS Performed at [facility], [address], [place], [place] [address]    Report Status PENDING   Incomplete     Signed: Alanzo Lamb  [facility] 10/29/2019, 11:28 AM

## 2019-10-29 NOTE — TOC Transition Note (Signed)
Transition of Care [facility]) - CM/SW Discharge Note  Patient Details  Name: Alec Larson MRN: [id number] Date of Birth: 1946-01-03  [facility]) CM/SW Contact:  Sherie Don, LCSW Phone Number: 10/29/2019, 11:43 AM  Clinical Narrative: Patient will discharge today. CSW updated Georgina Snell with [facility] regarding discharge. HH orders have been placed for RN/PT. CSW updated patient. TOC signing off.  Final next level of care: [place] Barriers to Discharge: Barriers Resolved  Patient Goals and CMS Choice Patient states their goals for this hospitalization and ongoing recovery are:: Discharge home with [facility] CMS Medicare.gov Compare Post Acute Care list provided to:: Patient Choice offered to / list presented to : Patient  Discharge Plan and Services In-house Referral: Clinical Social Work Post Acute Care Choice: Home Health          DME Arranged: N/A DME Agency: NA HH Arranged: RN, PT [place] Agency: [place] Date [place]: 10/29/19 Time [place]: 1225 Representative spoke with at [place]: Georgina Snell  Readmission Risk Interventions No flowsheet data found.

## 2019-10-29 NOTE — Progress Notes (Signed)
Patient with no complaints at this time. Respirations even and unlabored. Skin warm/dry. Discharge instructions reviewed with patient at this time. Patient given opportunity to voice concerns/ask questions. Patient taken to main entrance for pick up.

## 2019-10-31 LAB — CULTURE, BLOOD (ROUTINE X 2)
Culture: NO GROWTH
Culture: NO GROWTH
Special Requests: ADEQUATE
Special Requests: ADEQUATE

## 2019-11-01 LAB — URINE CULTURE

## 2019-11-02 ENCOUNTER — Telehealth: Payer: Self-pay

## 2019-11-02 ENCOUNTER — Other Ambulatory Visit: Payer: Self-pay

## 2019-11-02 MED ORDER — SULFAMETHOXAZOLE-TRIMETHOPRIM 800-160 MG PO TABS: 1.0000 | ORAL_TABLET | Freq: Two times a day (BID) | ORAL | 0 refills | Status: DC

## 2019-11-02 NOTE — Telephone Encounter (Signed)
-----   Message from Cleon Gustin, MD sent at 11/02/2019  8:00 AM EDT ----- Bactrim DS BID for 7 days ----- Message ----- From: Dorisann Frames, RN Sent: 11/01/2019  10:55 AM EDT To: Cleon Gustin, MD  Please review

## 2019-11-02 NOTE — Telephone Encounter (Signed)
Left message to return call. rx sent in to pharmacy.

## 2019-11-03 ENCOUNTER — Telehealth: Payer: Self-pay

## 2019-11-03 ENCOUNTER — Other Ambulatory Visit: Payer: Self-pay

## 2019-11-03 DIAGNOSIS — R339 Retention of urine, unspecified: Secondary | ICD-10-CM

## 2019-11-03 NOTE — Telephone Encounter (Signed)
Pt called and informed of Rx. Pt reports he was recently discharged from hospital regarding a UTI. Pt wishes to repeat a urine and culture tomorrow am.  Dr. Alyson Ingles aware and orders and appt created.

## 2019-11-03 NOTE — Telephone Encounter (Signed)
-----   Message from Cleon Gustin, MD sent at 11/02/2019  8:00 AM EDT ----- Bactrim DS BID for 7 days ----- Message ----- From: Dorisann Frames, RN Sent: 11/01/2019  10:55 AM EDT To: Cleon Gustin, MD  Please review

## 2019-11-04 ENCOUNTER — Other Ambulatory Visit: Payer: Self-pay

## 2019-11-04 ENCOUNTER — Other Ambulatory Visit: Payer: Medicare PPO

## 2019-11-04 DIAGNOSIS — R339 Retention of urine, unspecified: Secondary | ICD-10-CM

## 2019-11-04 LAB — MICROSCOPIC EXAMINATION
Bacteria, UA: NONE SEEN
Epithelial Cells (non renal): NONE SEEN /hpf (ref 0–10)
Renal Epithel, UA: NONE SEEN /hpf
WBC, UA: NONE SEEN /hpf (ref 0–5)

## 2019-11-04 LAB — URINALYSIS, ROUTINE W REFLEX MICROSCOPIC
Bilirubin, UA: NEGATIVE
Ketones, UA: NEGATIVE
Leukocytes,UA: NEGATIVE
Nitrite, UA: NEGATIVE
Specific Gravity, UA: 1.025 (ref 1.005–1.030)
Urobilinogen, Ur: 0.2 mg/dL (ref 0.2–1.0)
pH, UA: 5.5 (ref 5.0–7.5)

## 2019-11-06 LAB — URINE CULTURE: Organism ID, Bacteria: NO GROWTH

## 2019-11-11 NOTE — Progress Notes (Signed)
Sent via mychart

## 2020-03-09 ENCOUNTER — Encounter (INDEPENDENT_AMBULATORY_CARE_PROVIDER_SITE_OTHER): Payer: Self-pay | Admitting: *Deleted

## 2020-05-02 DIAGNOSIS — I480 Paroxysmal atrial fibrillation: Secondary | ICD-10-CM | POA: Insufficient documentation

## 2020-05-02 DIAGNOSIS — J189 Pneumonia, unspecified organism: Secondary | ICD-10-CM | POA: Insufficient documentation

## 2020-05-25 DIAGNOSIS — I350 Nonrheumatic aortic (valve) stenosis: Secondary | ICD-10-CM | POA: Insufficient documentation

## 2020-06-27 DIAGNOSIS — N183 Chronic kidney disease, stage 3 unspecified: Secondary | ICD-10-CM | POA: Insufficient documentation

## 2020-08-03 ENCOUNTER — Ambulatory Visit: Payer: Medicare PPO | Admitting: Urology

## 2020-08-04 ENCOUNTER — Ambulatory Visit: Payer: Medicare PPO | Admitting: Urology

## 2020-11-08 DIAGNOSIS — E785 Hyperlipidemia, unspecified: Secondary | ICD-10-CM | POA: Insufficient documentation

## 2020-12-28 DIAGNOSIS — Z8673 Personal history of transient ischemic attack (TIA), and cerebral infarction without residual deficits: Secondary | ICD-10-CM | POA: Insufficient documentation

## 2021-01-11 HISTORY — PX: JOINT REPLACEMENT: SHX530

## 2021-04-02 ENCOUNTER — Telehealth: Payer: Self-pay

## 2021-04-02 NOTE — Telephone Encounter (Signed)
Spoke with Ms Mallie Snooks and she states patient started Bactrim Ds that they had on hand last week. Patient has one more pill left. Ms. Mallie Snooks states patient is afebrile. Patient continues to have urinary frequency and cloudy urine. Ms. Mallie Snooks will increase patients fluid intake and will take patient  to ER/urgent care if patient develops an altered mental status or spike fever before appointment.

## 2021-04-02 NOTE — Telephone Encounter (Signed)
Patient is having UTI symptoms and the next available appt is this Wednesday. Patient wanted to know if there were any  recommendations to relieve irritation and pain until then.

## 2021-04-04 ENCOUNTER — Other Ambulatory Visit: Payer: Self-pay

## 2021-04-04 ENCOUNTER — Ambulatory Visit (INDEPENDENT_AMBULATORY_CARE_PROVIDER_SITE_OTHER): Payer: Medicare Other | Admitting: Urology

## 2021-04-04 ENCOUNTER — Encounter: Payer: Self-pay | Admitting: Urology

## 2021-04-04 VITALS — BP 118/63 | HR 58 | Ht 64.0 in | Wt 220.0 lb

## 2021-04-04 DIAGNOSIS — R339 Retention of urine, unspecified: Secondary | ICD-10-CM

## 2021-04-04 DIAGNOSIS — N138 Other obstructive and reflux uropathy: Secondary | ICD-10-CM | POA: Diagnosis not present

## 2021-04-04 DIAGNOSIS — N401 Enlarged prostate with lower urinary tract symptoms: Secondary | ICD-10-CM | POA: Diagnosis not present

## 2021-04-04 DIAGNOSIS — N3001 Acute cystitis with hematuria: Secondary | ICD-10-CM

## 2021-04-04 LAB — URINALYSIS, ROUTINE W REFLEX MICROSCOPIC
Bilirubin, UA: NEGATIVE
Glucose, UA: NEGATIVE
Ketones, UA: NEGATIVE
Nitrite, UA: NEGATIVE
Specific Gravity, UA: 1.025 (ref 1.005–1.030)
Urobilinogen, Ur: 1 mg/dL (ref 0.2–1.0)
pH, UA: 6 (ref 5.0–7.5)

## 2021-04-04 LAB — MICROSCOPIC EXAMINATION
Renal Epithel, UA: NONE SEEN /hpf
WBC, UA: >30 /hpf — AB (ref 0–5)

## 2021-04-04 LAB — BLADDER SCAN AMB NON-IMAGING: Scan Result: 103

## 2021-04-04 MED ORDER — NITROFURANTOIN MONOHYD MACRO 100 MG PO CAPS: 100.0000 mg | ORAL_CAPSULE | Freq: Two times a day (BID) | ORAL | 0 refills | Status: DC

## 2021-04-04 MED ORDER — ALFUZOSIN HCL ER 10 MG PO TB24: 10.0000 mg | ORAL_TABLET | Freq: Every evening | ORAL | 11 refills | Status: DC

## 2021-04-04 NOTE — Progress Notes (Signed)
04/04/2021 2:51 PM   Alec Larson May 11, 1945 [id number]  Referring provider: Alfonso Ramus, MD [address] [place],  [place] [address]  UTI   HPI: Alec Larson is a 76yo here for followup for BPH and UTI. Starting 2 weeks ago he developed cloudy urine with associated dysuria. He sues a urinal due to poor mobility. No hematuria. IPSS 18 QOl 4. Nocturia 6x. Urine stream intermittently strong. He has straining to urinate. UA today is concerning for infection.    PMH: Past Medical History:  Diagnosis Date   Anxiety    BPH (benign prostatic hyperplasia)    Diabetes mellitus without complication (HCC)    GERD (gastroesophageal reflux disease)    H/O renal calculi    Hypercholesteremia    Hypertension     Surgical History: Past Surgical History:  Procedure Laterality Date   BIOPSY  03/19/2019   Procedure: BIOPSY;  Surgeon: Rogene Houston, MD;  [facility];  Service: Endoscopy;;   CERVICAL SPINE SURGERY     COLONOSCOPY N/A 03/19/2019   Procedure: COLONOSCOPY;  Surgeon: Rogene Houston, MD;  [facility];  Service: Endoscopy;  Laterality: N/A;  [place] N/A 01/20/2019   Procedure: CYSTOSCOPY WITH INSERTION OF UROLIFT;  Surgeon: Cleon Gustin, MD;  Location: [facility];  Service: Urology;  Laterality: N/A;  30 MINS   ESOPHAGOGASTRODUODENOSCOPY N/A 03/19/2019   Procedure: ESOPHAGOGASTRODUODENOSCOPY (EGD);  Surgeon: Rogene Houston, MD;  [facility];  Service: Endoscopy;  Laterality: N/A;  730   POLYPECTOMY  03/19/2019   Procedure: POLYPECTOMY;  Surgeon: Rogene Houston, MD;  [facility];  Service: Endoscopy;;   TONSILLECTOMY     age 40    Home Medications:  Allergies as of 04/04/2021       Reactions   Aspirin Anaphylaxis, Hives   Penicillins Hives   Did it involve swelling of the face/tongue/throat, SOB, or low BP? No Did it involve sudden or severe rash/hives, skin peeling, or  any reaction on the inside of your mouth or nose? No Did you need to seek medical attention at a hospital or doctor's office?Never had a reaction When did it last happen? Never had a reaction to this medication. If all above answers are NO, may proceed with cephalosporin use.   Bee Venom Hives        Medication List        Accurate as of April 04, 2021  2:51 PM. If you have any questions, ask your nurse or doctor.          amLODipine 10 MG tablet Commonly known as: NORVASC Take 10 mg by mouth daily.   atorvastatin 40 MG tablet Commonly known as: LIPITOR Take 40 mg by mouth daily.   azelastine 0.05 % ophthalmic solution Commonly known as: OPTIVAR Place 1 drop into both eyes 2 (two) times daily as needed (itching/swelling eyes. (allergies)).   benazepril 40 MG tablet Commonly known as: LOTENSIN Take 40 mg by mouth daily in the afternoon.   DULoxetine 60 MG capsule Commonly known as: CYMBALTA Take 60 mg by mouth daily.   fluticasone 50 MCG/ACT nasal spray Commonly known as: FLONASE Place 2 sprays into both nostrils daily as needed (allergies.).   gabapentin 300 MG capsule Commonly known as: NEURONTIN Take 300-600 mg by mouth See admin instructions. Take 2 capsules (600 mg) in the morning, 1 capsule (300 mg) by mouth at noon, & 1 capsule (  300 mg) by mouth at night   glimepiride 4 MG tablet Commonly known as: AMARYL Take 4 mg by mouth daily with breakfast.   hydrochlorothiazide 25 MG tablet Commonly known as: HYDRODIURIL Take 25 mg by mouth daily.   loperamide 2 MG capsule Commonly known as: IMODIUM Take 1 capsule (2 mg total) by mouth daily with breakfast. What changed:  when to take this reasons to take this   metoprolol tartrate 50 MG tablet Commonly known as: LOPRESSOR Take 50 mg by mouth 2 (two) times daily with a meal.   NovoLIN 70/30 (70-30) 100 UNIT/ML injection Generic drug: insulin NPH-regular Human Inject 40-60 Units into the skin See  admin instructions. Inject 40 units into the skin in the morning and 60 units at night   omeprazole 20 MG capsule Commonly known as: PRILOSEC Take 20 mg by mouth daily before breakfast.   oxyCODONE-acetaminophen 10-325 MG tablet Commonly known as: PERCOCET Take 1 tablet by mouth every 6 (six) hours as needed for pain.   sitaGLIPtin 50 MG tablet Commonly known as: JANUVIA Take 50 mg by mouth daily.   spironolactone 25 MG tablet Commonly known as: ALDACTONE Take 25 mg by mouth daily.   sulfamethoxazole-trimethoprim 800-160 MG tablet Commonly known as: BACTRIM DS Take 1 tablet by mouth every 12 (twelve) hours.   Trulicity 0.25 [place]/7.0WC Sopn Generic drug: Dulaglutide Inject into the skin.   VITAMIN C PO Take 1 tablet by mouth daily.        Allergies:  Allergies  Allergen Reactions   Aspirin Anaphylaxis and Hives   Penicillins Hives    Did it involve swelling of the face/tongue/throat, SOB, or low BP? No Did it involve sudden or severe rash/hives, skin peeling, or any reaction on the inside of your mouth or nose? No Did you need to seek medical attention at a hospital or doctor's office?Never had a reaction When did it last happen? Never had a reaction to this medication. If all above answers are NO, may proceed with cephalosporin use.    Bee Venom Hives    Family History: No family history on file.  Social History:  reports that he has quit smoking. He has never used smokeless tobacco. He reports that he does not currently use alcohol. He reports that he does not use drugs.  ROS: All other review of systems were reviewed and are negative except what is noted above in HPI  Physical Exam: BP 118/63    Pulse (!) 58    Ht 5\' 4"  (1.626 m)    Wt 220 lb (99.8 kg)    BMI 37.76 kg/m   Constitutional:  Alert and oriented, No acute distress. HEENT: [place] AT, moist mucus membranes.  Trachea midline, no masses. Cardiovascular: No clubbing, cyanosis, or edema. Respiratory:  Normal respiratory effort, no increased work of breathing. GI: Abdomen is soft, nontender, nondistended, no abdominal masses GU: No CVA tenderness.  Lymph: No cervical or inguinal lymphadenopathy. Skin: No rashes, bruises or suspicious lesions. Neurologic: Grossly intact, no focal deficits. Psychiatric: Normal mood and affect.  Laboratory Data: Lab Results  Component Value Date   WBC 10.1 10/29/2019   HGB 12.5 (L) 10/29/2019   HCT 39.5 10/29/2019   MCV 89.0 10/29/2019   PLT 231 10/29/2019    Lab Results  Component Value Date   CREATININE 1.26 (H) 10/29/2019    No results found for: PSA  No results found for: TESTOSTERONE  Lab Results  Component Value Date   HGBA1C 8.0 (H) 10/26/2019  Urinalysis    Component Value Date/Time   COLORURINE YELLOW 10/26/2019 1312   APPEARANCEUR Clear 11/04/2019 0930   LABSPEC 1.017 10/26/2019 1312   PHURINE 6.0 10/26/2019 1312   GLUCOSEU Trace (A) 11/04/2019 0930   HGBUR MODERATE (A) 10/26/2019 1312   BILIRUBINUR Negative 11/04/2019 0930   KETONESUR 5 (A) 10/26/2019 1312   PROTEINUR 3+ (A) 11/04/2019 0930   PROTEINUR >=300 (A) 10/26/2019 1312   NITRITE Negative 11/04/2019 0930   NITRITE NEGATIVE 10/26/2019 1312   LEUKOCYTESUR Negative 11/04/2019 0930   LEUKOCYTESUR SMALL (A) 10/26/2019 1312    Lab Results  Component Value Date   LABMICR See below: 11/04/2019   WBCUA None seen 11/04/2019   LABEPIT None seen 11/04/2019   MUCUS Present 11/04/2019   BACTERIA None seen 11/04/2019    Pertinent Imaging:  No results found for this or any previous visit.  No results found for this or any previous visit.  No results found for this or any previous visit.  No results found for this or any previous visit.  No results found for this or any previous visit.  No results found for this or any previous visit.  No results found for this or any previous visit.  No results found for this or any previous visit.   Assessment &  Plan:    1. Incomplete emptying of bladder -continue uroxatral 10g daily - Urinalysis, Routine w reflex microscopic - BLADDER SCAN AMB NON-IMAGING  2. Benign prostatic hyperplasia with urinary obstruction Uroxatral 10mg  daily  3. Acute cystitis -Urine for culture -macrobid 100mg  bid for 7 days   No follow-ups on file.  Nicolette Bang, MD  [facility]

## 2021-04-04 NOTE — Patient Instructions (Signed)

## 2021-04-04 NOTE — Progress Notes (Signed)
post void residual= 197mL

## 2021-04-09 LAB — URINE CULTURE

## 2021-04-30 ENCOUNTER — Encounter: Payer: Self-pay | Admitting: Urology

## 2021-04-30 ENCOUNTER — Other Ambulatory Visit: Payer: Self-pay

## 2021-04-30 ENCOUNTER — Ambulatory Visit (INDEPENDENT_AMBULATORY_CARE_PROVIDER_SITE_OTHER): Payer: Medicare Other | Admitting: Urology

## 2021-04-30 VITALS — BP 135/66 | HR 75

## 2021-04-30 DIAGNOSIS — N138 Other obstructive and reflux uropathy: Secondary | ICD-10-CM

## 2021-04-30 DIAGNOSIS — R339 Retention of urine, unspecified: Secondary | ICD-10-CM

## 2021-04-30 DIAGNOSIS — N390 Urinary tract infection, site not specified: Secondary | ICD-10-CM | POA: Diagnosis not present

## 2021-04-30 DIAGNOSIS — R351 Nocturia: Secondary | ICD-10-CM

## 2021-04-30 DIAGNOSIS — N401 Enlarged prostate with lower urinary tract symptoms: Secondary | ICD-10-CM | POA: Diagnosis not present

## 2021-04-30 LAB — MICROSCOPIC EXAMINATION
Epithelial Cells (non renal): NONE SEEN /hpf (ref 0–10)
RBC, Urine: NONE SEEN /hpf (ref 0–2)
Renal Epithel, UA: NONE SEEN /hpf
WBC, UA: NONE SEEN /hpf (ref 0–5)

## 2021-04-30 LAB — URINALYSIS, ROUTINE W REFLEX MICROSCOPIC
Bilirubin, UA: NEGATIVE
Glucose, UA: NEGATIVE
Ketones, UA: NEGATIVE
Leukocytes,UA: NEGATIVE
Nitrite, UA: NEGATIVE
RBC, UA: NEGATIVE
Specific Gravity, UA: 1.015 (ref 1.005–1.030)
Urobilinogen, Ur: 0.2 mg/dL (ref 0.2–1.0)
pH, UA: 6 (ref 5.0–7.5)

## 2021-04-30 LAB — BLADDER SCAN AMB NON-IMAGING: Scan Result: 42

## 2021-04-30 MED ORDER — NITROFURANTOIN MACROCRYSTAL 50 MG PO CAPS: 50.0000 mg | ORAL_CAPSULE | Freq: Every day | ORAL | 11 refills | Status: DC

## 2021-04-30 MED ORDER — SILODOSIN 8 MG PO CAPS: 8.0000 mg | ORAL_CAPSULE | Freq: Every day | ORAL | 11 refills | Status: DC

## 2021-04-30 NOTE — Progress Notes (Signed)
04/30/2021 3:07 PM   Alec Larson [birth year] [id number]  Referring provider: Heywood Footman, MD [address] [facility] [place],  [place] [address]  Followup BPh and chronic cystitis   HPI: Mr Alec Larson is a 75yo here for followup for BPh with incomplete emptying and chronic cystitis.  IPSS 13 QOL 4 on uroxatral 10mg  qhs. He has a weak stream on uroxatral. Nocturia 3-4x. No straining to urinate. No hematuria or dysuria. No UTIs since last visit. No other complaints today   PMH: Past Medical History:  Diagnosis Date   Anxiety    BPH (benign prostatic hyperplasia)    Diabetes mellitus without complication (HCC)    GERD (gastroesophageal reflux disease)    H/O renal calculi    Hypercholesteremia    Hypertension     Surgical History: Past Surgical History:  Procedure Laterality Date   BIOPSY  03/19/2019   Procedure: BIOPSY;  Surgeon: Malissa Hippo, MD;  [facility];  Service: Endoscopy;;   CERVICAL SPINE SURGERY     COLONOSCOPY N/A 03/19/2019   Procedure: COLONOSCOPY;  Surgeon: Malissa Hippo, MD;  [facility];  Service: Endoscopy;  Laterality: N/A;  730   CYSTOSCOPY WITH INSERTION OF UROLIFT N/A 01/20/2019   Procedure: CYSTOSCOPY WITH INSERTION OF UROLIFT;  Surgeon: Malen Gauze, MD;  Location: [facility];  Service: Urology;  Laterality: N/A;  30 MINS   ESOPHAGOGASTRODUODENOSCOPY N/A 03/19/2019   Procedure: ESOPHAGOGASTRODUODENOSCOPY (EGD);  Surgeon: Malissa Hippo, MD;  [facility];  Service: Endoscopy;  Laterality: N/A;  730   POLYPECTOMY  03/19/2019   Procedure: POLYPECTOMY;  Surgeon: Malissa Hippo, MD;  [facility];  Service: Endoscopy;;   TONSILLECTOMY     age 50    Home Medications:  Allergies as of 04/30/2021       Reactions   Aspirin Anaphylaxis, Hives   Bee Venom Hives, Other (See Comments)   All bees   Penicillins Hives   Did it involve swelling of the face/tongue/throat, SOB, or low BP? No Did it  involve sudden or severe rash/hives, skin peeling, or any reaction on the inside of your mouth or nose? No Did you need to seek medical attention at a hospital or doctor's office?Never had a reaction When did it last happen? Never had a reaction to this medication. If all above answers are NO, may proceed with cephalosporin use.        Medication List        Accurate as of April 30, 2021  3:07 PM. If you have any questions, ask your nurse or doctor.          alfuzosin 10 MG 24 hr tablet Commonly known as: UROXATRAL Take 1 tablet (10 mg total) by mouth at bedtime.   amLODipine 10 MG tablet Commonly known as: NORVASC Take 10 mg by mouth daily.   atorvastatin 40 MG tablet Commonly known as: LIPITOR Take 40 mg by mouth daily.   azelastine 0.05 % ophthalmic solution Commonly known as: OPTIVAR Place 1 drop into both eyes 2 (two) times daily as needed (itching/swelling eyes. (allergies)).   benazepril 40 MG tablet Commonly known as: LOTENSIN Take 40 mg by mouth daily in the afternoon.   diltiazem 300 MG 24 hr capsule Commonly known as: CARDIZEM CD Take 300 mg by mouth daily.   DULoxetine 60 MG capsule Commonly known as: CYMBALTA Take 60 mg by mouth daily.   Eliquis 5 MG Tabs tablet Generic drug: apixaban  Take 5 mg by mouth 2 (two) times daily.   fluticasone 50 MCG/ACT nasal spray Commonly known as: FLONASE Place 2 sprays into both nostrils daily as needed (allergies.).   gabapentin 300 MG capsule Commonly known as: NEURONTIN Take 300-600 mg by mouth See admin instructions. Take 2 capsules (600 mg) in the morning, 1 capsule (300 mg) by mouth at noon, & 1 capsule (300 mg) by mouth at night   glimepiride 4 MG tablet Commonly known as: AMARYL Take 4 mg by mouth daily with breakfast.   hydrochlorothiazide 25 MG tablet Commonly known as: HYDRODIURIL Take 25 mg by mouth daily.   isosorbide dinitrate 10 MG tablet Commonly known as: ISORDIL Take 10 mg by mouth 3  (three) times daily.   loperamide 2 MG capsule Commonly known as: IMODIUM Take 1 capsule (2 mg total) by mouth daily with breakfast. What changed:  when to take this reasons to take this   metoprolol tartrate 50 MG tablet Commonly known as: LOPRESSOR Take 50 mg by mouth 2 (two) times daily with a meal.   nitrofurantoin (macrocrystal-monohydrate) 100 MG capsule Commonly known as: MACROBID Take 1 capsule (100 mg total) by mouth every 12 (twelve) hours.   NovoLIN 70/30 (70-30) 100 UNIT/ML injection Generic drug: insulin NPH-regular Human Inject 40-60 Units into the skin See admin instructions. Inject 40 units into the skin in the morning and 60 units at night   omeprazole 20 MG capsule Commonly known as: PRILOSEC Take 20 mg by mouth daily before breakfast.   oxyCODONE-acetaminophen 10-325 MG tablet Commonly known as: PERCOCET Take 1 tablet by mouth every 6 (six) hours as needed for pain.   sitaGLIPtin 50 MG tablet Commonly known as: JANUVIA Take 50 mg by mouth daily.   spironolactone 25 MG tablet Commonly known as: ALDACTONE Take 25 mg by mouth daily.   sulfamethoxazole-trimethoprim 800-160 MG tablet Commonly known as: BACTRIM DS Take 1 tablet by mouth every 12 (twelve) hours.   Trulicity 0.75 MG/0.5ML Sopn Generic drug: Dulaglutide Inject into the skin.   VITAMIN C PO Take 1 tablet by mouth daily.        Allergies:  Allergies  Allergen Reactions   Aspirin Anaphylaxis and Hives   Bee Venom Hives and Other (See Comments)    All bees    Penicillins Hives    Did it involve swelling of the face/tongue/throat, SOB, or low BP? No Did it involve sudden or severe rash/hives, skin peeling, or any reaction on the inside of your mouth or nose? No Did you need to seek medical attention at a hospital or doctor's office?Never had a reaction When did it last happen? Never had a reaction to this medication. If all above answers are NO, may proceed with cephalosporin  use.     Family History: No family history on file.  Social History:  reports that he has quit smoking. He has never used smokeless tobacco. He reports that he does not currently use alcohol. He reports that he does not use drugs.  ROS: All other review of systems were reviewed and are negative except what is noted above in HPI  Physical Exam: BP 135/66   Pulse 75   Constitutional:  Alert and oriented, No acute distress. HEENT: [place] AT, moist mucus membranes.  Trachea midline, no masses. Cardiovascular: No clubbing, cyanosis, or edema. Respiratory: Normal respiratory effort, no increased work of breathing. GI: Abdomen is soft, nontender, nondistended, no abdominal masses GU: No CVA tenderness.  Lymph: No cervical or inguinal  lymphadenopathy. Skin: No rashes, bruises or suspicious lesions. Neurologic: Grossly intact, no focal deficits, moving all 4 extremities. Psychiatric: Normal mood and affect.  Laboratory Data: Lab Results  Component Value Date   WBC 10.1 10/29/2019   HGB 12.5 (L) 10/29/2019   HCT 39.5 10/29/2019   MCV 89.0 10/29/2019   PLT 231 10/29/2019    Lab Results  Component Value Date   CREATININE 1.26 (H) 10/29/2019    No results found for: PSA  No results found for: TESTOSTERONE  Lab Results  Component Value Date   HGBA1C 8.0 (H) 10/26/2019    Urinalysis    Component Value Date/Time   COLORURINE YELLOW 10/26/2019 1312   APPEARANCEUR Clear 04/04/2021 1441   LABSPEC 1.017 10/26/2019 1312   PHURINE 6.0 10/26/2019 1312   GLUCOSEU Negative 04/04/2021 1441   HGBUR MODERATE (A) 10/26/2019 1312   BILIRUBINUR Negative 04/04/2021 1441   KETONESUR 5 (A) 10/26/2019 1312   PROTEINUR 3+ (A) 04/04/2021 1441   PROTEINUR >=300 (A) 10/26/2019 1312   NITRITE Negative 04/04/2021 1441   NITRITE NEGATIVE 10/26/2019 1312   LEUKOCYTESUR 2+ (A) 04/04/2021 1441   LEUKOCYTESUR SMALL (A) 10/26/2019 1312    Lab Results  Component Value Date   LABMICR See below:  04/04/2021   WBCUA >30 (A) 04/04/2021   LABEPIT 0-10 04/04/2021   MUCUS Present 04/04/2021   BACTERIA Few 04/04/2021    Pertinent Imaging:  No results found for this or any previous visit.  No results found for this or any previous visit.  No results found for this or any previous visit.  No results found for this or any previous visit.  No results found for this or any previous visit.  No results found for this or any previous visit.  No results found for this or any previous visit.  No results found for this or any previous visit.   Assessment & Plan:    1. Incomplete emptying of bladder -Start rapaflo 8mg  qhs - BLADDER SCAN AMB NON-IMAGING - Urinalysis, Routine w reflex microscopic  2. Benign prostatic hyperplasia with urinary obstruction -Rapaflo 8mg  QHS - BLADDER SCAN AMB NON-IMAGING - Urinalysis, Routine w reflex microscopic  3. Recurrent UTI Macrobid 50mg  qhs  4. Nocturia -switch to rapaflo 8mg  qhs   No follow-ups on file.  Wilkie Aye, MD  [facility]

## 2021-04-30 NOTE — Progress Notes (Signed)
post void residual=42 ?

## 2021-04-30 NOTE — Patient Instructions (Signed)

## 2021-05-21 ENCOUNTER — Other Ambulatory Visit: Payer: Self-pay | Admitting: Orthopedic Surgery

## 2021-05-21 ENCOUNTER — Other Ambulatory Visit (HOSPITAL_COMMUNITY): Payer: Self-pay | Admitting: Orthopedic Surgery

## 2021-05-21 DIAGNOSIS — M75101 Unspecified rotator cuff tear or rupture of right shoulder, not specified as traumatic: Secondary | ICD-10-CM

## 2021-05-21 DIAGNOSIS — M67911 Unspecified disorder of synovium and tendon, right shoulder: Secondary | ICD-10-CM

## 2021-06-08 ENCOUNTER — Ambulatory Visit (HOSPITAL_COMMUNITY)
Admission: RE | Admit: 2021-06-08 | Discharge: 2021-06-08 | Disposition: A | Discharge status: Home / Self Care | Payer: Medicare Other | Source: Ambulatory Visit | Attending: Orthopedic Surgery | Admitting: Orthopedic Surgery

## 2021-06-08 DIAGNOSIS — M75101 Unspecified rotator cuff tear or rupture of right shoulder, not specified as traumatic: Secondary | ICD-10-CM | POA: Insufficient documentation

## 2021-06-11 ENCOUNTER — Other Ambulatory Visit: Payer: Medicare Other

## 2021-07-21 ENCOUNTER — Encounter (HOSPITAL_COMMUNITY): Payer: Self-pay | Admitting: *Deleted

## 2021-07-21 ENCOUNTER — Emergency Department (HOSPITAL_COMMUNITY)
Admission: EM | Admit: 2021-07-21 | Discharge: 2021-07-21 | Disposition: A | Discharge status: Home / Self Care | Payer: Medicare Other | Attending: Emergency Medicine | Admitting: Emergency Medicine

## 2021-07-21 ENCOUNTER — Other Ambulatory Visit: Payer: Self-pay

## 2021-07-21 ENCOUNTER — Emergency Department (HOSPITAL_COMMUNITY): Payer: Medicare Other

## 2021-07-21 DIAGNOSIS — W228XXA Striking against or struck by other objects, initial encounter: Secondary | ICD-10-CM | POA: Diagnosis not present

## 2021-07-21 DIAGNOSIS — L03115 Cellulitis of right lower limb: Secondary | ICD-10-CM

## 2021-07-21 DIAGNOSIS — I10 Essential (primary) hypertension: Secondary | ICD-10-CM | POA: Insufficient documentation

## 2021-07-21 DIAGNOSIS — Z794 Long term (current) use of insulin: Secondary | ICD-10-CM | POA: Diagnosis not present

## 2021-07-21 DIAGNOSIS — E119 Type 2 diabetes mellitus without complications: Secondary | ICD-10-CM | POA: Insufficient documentation

## 2021-07-21 DIAGNOSIS — Z7901 Long term (current) use of anticoagulants: Secondary | ICD-10-CM | POA: Insufficient documentation

## 2021-07-21 DIAGNOSIS — S80821A Blister (nonthermal), right lower leg, initial encounter: Secondary | ICD-10-CM | POA: Insufficient documentation

## 2021-07-21 DIAGNOSIS — R600 Localized edema: Secondary | ICD-10-CM | POA: Insufficient documentation

## 2021-07-21 DIAGNOSIS — S8991XA Unspecified injury of right lower leg, initial encounter: Secondary | ICD-10-CM | POA: Diagnosis present

## 2021-07-21 DIAGNOSIS — Z79899 Other long term (current) drug therapy: Secondary | ICD-10-CM | POA: Diagnosis not present

## 2021-07-21 LAB — URINALYSIS, ROUTINE W REFLEX MICROSCOPIC
Bacteria, UA: NONE SEEN
Bilirubin Urine: NEGATIVE
Glucose, UA: NEGATIVE mg/dL
Hgb urine dipstick: NEGATIVE
Ketones, ur: NEGATIVE mg/dL
Leukocytes,Ua: NEGATIVE
Nitrite: NEGATIVE
Protein, ur: 100 mg/dL — AB
Specific Gravity, Urine: 1.013 (ref 1.005–1.030)
pH: 6 (ref 5.0–8.0)

## 2021-07-21 MED ORDER — SULFAMETHOXAZOLE-TRIMETHOPRIM 800-160 MG PO TABS
1.0000 | ORAL_TABLET | Freq: Two times a day (BID) | ORAL | 0 refills | Status: AC
Start: 2021-07-21 — End: 2021-07-28

## 2021-07-21 MED ORDER — TRAMADOL HCL 50 MG PO TABS: 50.0000 mg | ORAL_TABLET | Freq: Two times a day (BID) | ORAL | 0 refills | Status: AC | PRN

## 2021-07-21 NOTE — ED Provider Notes (Signed)
[facility] Provider Note   CSN: [id number] Arrival date & time: 07/21/21  1104     History  Chief Complaint  Patient presents with   Back Pain    Alec Larson is a 76 y.o. male.  HPI  76 year old male with past medical history of HTN, HLD, DM presents the emergency department with right shin injury and concern for ongoing UTI.  He is accompanied by his daughter.  Is reportedly on Macrobid for urinary tract infection.  Symptoms are slightly improved but he complains of ongoing frequency.  About 5 days ago he hit his right shin on a wooden coffee table.  Since then has been having some redness and swelling of the anterior shin that has progressed today.  No fever or systemic symptoms.  No discoloration/numbness of the right foot.  Home Medications Prior to Admission medications   Medication Sig Start Date End Date Taking? Authorizing Provider  atorvastatin (LIPITOR) 40 MG tablet Take 40 mg by mouth daily. 12/28/18  Yes [provider]  azelastine (OPTIVAR) 0.05 % ophthalmic solution Place 1 drop into both eyes 2 (two) times daily as needed (itching/swelling eyes. (allergies)).  08/22/18  Yes [provider]  benazepril (LOTENSIN) 40 MG tablet Take 40 mg by mouth daily in the afternoon.  12/28/18  Yes [provider]  Cholecalciferol 250 MCG (10000 UT) CAPS Take 1 capsule by mouth daily.   Yes [provider]  Coenzyme Q10 ([employer] COQ-10) 200 MG capsule Take 200 mg by mouth daily.   Yes [provider]  diltiazem (CARDIZEM CD) 300 MG 24 hr capsule Take 300 mg by mouth daily. 03/11/21  Yes [provider]  DULoxetine (CYMBALTA) 60 MG capsule Take 60 mg by mouth daily. 12/28/18  Yes [provider]  ELIQUIS 5 MG TABS tablet Take 5 mg by mouth 2 (two) times daily. 04/21/21  Yes [provider]  fluticasone (FLONASE) 50 MCG/ACT nasal spray Place 2 sprays into both nostrils daily as needed (allergies.).     Yes [provider]  gabapentin (NEURONTIN) 300 MG capsule Take 300-600 mg by mouth See admin instructions. Take 2 capsules (600 mg) in the morning, 1 capsule (300 mg) by mouth at noon, & 2 capsules (600 mg) by mouth at night 12/28/18  Yes [provider]  HUMULIN 70/30 KWIKPEN (70-30) 100 UNIT/ML KwikPen Inject 50-60 Units into the skin in the morning and at bedtime. Inject 50 units into the skin in the morning and 65 units into the skin at dinner. 07/18/21  Yes [provider]  isosorbide dinitrate (ISORDIL) 10 MG tablet Take 10 mg by mouth 3 (three) times daily. 04/21/21  Yes [provider]  loperamide (IMODIUM) 2 MG capsule Take 1 capsule (2 mg total) by mouth daily with breakfast. Patient taking differently: Take 2 mg by mouth daily as needed for diarrhea or loose stools. 03/19/19  Yes Rehman, Mechele Dawley, MD  Multiple Vitamins-Minerals (MULTIVITAMIN ADULTS 50+) TABS Take 1 tablet by mouth daily.   Yes [provider]  nitrofurantoin (MACRODANTIN) 50 MG capsule Take 1 capsule (50 mg total) by mouth at bedtime. 04/30/21  Yes McKenzie, Candee Furbish, MD  Omega-3 Fatty Acids (FISH OIL) 1200 MG CAPS Take 1 capsule by mouth in the morning and at bedtime.   Yes [provider]  omeprazole (PRILOSEC) 20 MG capsule Take 20 mg by mouth daily before breakfast.  12/28/18  Yes [provider]  oxyCODONE (OXY IR/ROXICODONE) 5 MG immediate release  tablet Take 1-3 tablets by mouth every 4 (four) hours as needed for pain. 04/10/21  Yes [provider]  silodosin (RAPAFLO) 8 MG CAPS capsule Take 1 capsule (8 mg total) by mouth at bedtime. 04/30/21  Yes McKenzie, Candee Furbish, MD  spironolactone (ALDACTONE) 50 MG tablet Take 50 mg by mouth daily. 06/12/21  Yes [provider]  TRULICITY 1.5 EH/2.0NO SOPN Inject 1.5 mg into the skin once a week. Wednesdays. 05/17/21  Yes [provider]  vitamin B-12 (CYANOCOBALAMIN) 1000 MCG tablet Take 1,000 mcg  by mouth daily.   Yes [provider]  vitamin E 180 MG (400 UNITS) capsule Take 400 Units by mouth daily.   Yes [provider]  zinc gluconate 50 MG tablet Take 50 mg by mouth daily.   Yes [provider]      Allergies    Aspirin, Bee venom, and Penicillins    Review of Systems   Review of Systems  Constitutional:  Positive for fatigue. Negative for chills and fever.  Respiratory:  Negative for shortness of breath.   Cardiovascular:  Negative for chest pain.  Gastrointestinal:  Negative for abdominal pain, diarrhea and vomiting.  Genitourinary:  Positive for frequency.  Musculoskeletal:        Right leg injury  Skin:  Negative for rash.  Neurological:  Negative for headaches.    Physical Exam Updated Vital Signs BP (!) 171/84   Pulse 95   Temp 97.9 F (36.6 C) (Oral)   Resp 18   Ht '5\' 4"'$  (1.626 m)   Wt 102.1 kg   SpO2 95%   BMI 38.62 kg/m  Physical Exam Vitals and nursing note reviewed.  Constitutional:      General: He is not in acute distress.    Appearance: Normal appearance.  HENT:     Head: Normocephalic.     Mouth/Throat:     Mouth: Mucous membranes are moist.  Cardiovascular:     Rate and Rhythm: Normal rate.  Pulmonary:     Effort: Pulmonary effort is normal. No respiratory distress.  Abdominal:     Palpations: Abdomen is soft.     Tenderness: There is no abdominal tenderness.  Musculoskeletal:     Comments: What appears to be an old abrasion with a small blood-filled blister on the right anterior shin surrounded by erythema, warmth and edema of the right foot.  Perfusion appears equal in the bilateral lower extremities, no crepitus.  Skin:    General: Skin is warm.  Neurological:     Mental Status: He is alert and oriented to person, place, and time. Mental status is at baseline.  Psychiatric:        Mood and Affect: Mood normal.     ED Results / Procedures / Treatments   Labs (all labs ordered are listed, but only  abnormal results are displayed) Labs Reviewed  URINALYSIS, ROUTINE W REFLEX MICROSCOPIC - Abnormal; Notable for the following components:      Result Value   Protein, ur 100 (*)    All other components within normal limits    EKG None  Radiology No results found.  Procedures Procedures    Medications Ordered in ED Medications - No data to display  ED Course/ Medical Decision Making/ A&P                           Medical Decision Making Amount and/or Complexity of Data Reviewed Labs: ordered. Radiology:  ordered.  Risk Prescription drug management.   76 year old male presents emergency department concern for right shin injury and ongoing UTI.  Vitals are normal and stable on arrival.  He is afebrile.  Denies any systemic illness symptoms.  Urinalysis shows no UTI.  X-ray of the tib-fib shows no subcutaneous air/bony involvement.  He has obvious cellulitis of the right anterior shin.  Low suspicion for DVT given skin changes around wound on shin.    Plan for oral antibiotics.  Discussed with the patient and family member at bedside in depth of symptoms to return for including worsening redness, swelling, fever or systemic symptoms given he can be high risk for worsening infection in a diabetic setting.  No indication for emergent lab work at this time, do not believe he would warrant admission/IV antibiotics.  Plan for p.o. antibiotics and outpatient follow-up/trial.  Patient at this time appears safe and stable for discharge and close outpatient follow up. Discharge plan and strict return to ED precautions discussed, patient verbalizes understanding and agreement.        Final Clinical Impression(s) / ED Diagnoses Final diagnoses:  None    Rx / DC Orders ED Discharge Orders     None         Lorelle Gibbs, DO 07/21/21 1554

## 2021-07-21 NOTE — ED Notes (Signed)
ED Provider at bedside. 

## 2021-07-21 NOTE — Discharge Instructions (Signed)
You have been seen and discharged from the emergency department.  Your urinalysis showed no signs of acute infection.  Your x-ray showed soft tissue swelling.  You are dealing with cellulitis/infection of the lower extremity.  Take antibiotic as directed.  Keep the leg elevated.  Follow-up with your primary provider for further evaluation and further care. Take home medications as prescribed. If you have any worsening symptoms, redness/swelling extending up the knee, fevers, any further concerns for your health please return to an emergency department for further evaluation.

## 2021-07-21 NOTE — ED Triage Notes (Addendum)
Pt with chronic lower back pain reported due to his knee, pt use wheelchair at home. SO tested urine at home and reported that it was positive for UTI.  Pt has been treated for UTI recently.  Injury to left LE-kicked the coffee table about 5 days ago.  Discoloration to area, pt states getting worse. Blood blister noted as well.

## 2021-07-27 ENCOUNTER — Emergency Department (HOSPITAL_COMMUNITY): Payer: Medicare Other

## 2021-07-27 ENCOUNTER — Other Ambulatory Visit: Payer: Self-pay

## 2021-07-27 ENCOUNTER — Encounter (HOSPITAL_COMMUNITY): Payer: Self-pay | Admitting: *Deleted

## 2021-07-27 ENCOUNTER — Emergency Department (HOSPITAL_COMMUNITY)
Admission: EM | Admit: 2021-07-27 | Discharge: 2021-07-27 | Disposition: A | Discharge status: Home / Self Care | Payer: Medicare Other | Attending: Emergency Medicine | Admitting: Emergency Medicine

## 2021-07-27 DIAGNOSIS — E875 Hyperkalemia: Secondary | ICD-10-CM | POA: Diagnosis not present

## 2021-07-27 DIAGNOSIS — M25552 Pain in left hip: Secondary | ICD-10-CM | POA: Insufficient documentation

## 2021-07-27 DIAGNOSIS — M4800 Spinal stenosis, site unspecified: Secondary | ICD-10-CM | POA: Diagnosis not present

## 2021-07-27 LAB — CBC WITH DIFFERENTIAL/PLATELET
Abs Immature Granulocytes: 0.12 10*3/uL — ABNORMAL HIGH (ref 0.00–0.07)
Basophils Absolute: 0.1 10*3/uL (ref 0.0–0.1)
Basophils Relative: 1 %
Eosinophils Absolute: 0.3 10*3/uL (ref 0.0–0.5)
Eosinophils Relative: 4 %
HCT: 33.2 % — ABNORMAL LOW (ref 39.0–52.0)
Hemoglobin: 10.4 g/dL — ABNORMAL LOW (ref 13.0–17.0)
Immature Granulocytes: 1 %
Lymphocytes Relative: 14 %
Lymphs Abs: 1.3 10*3/uL (ref 0.7–4.0)
MCH: 29.4 pg (ref 26.0–34.0)
MCHC: 31.3 g/dL (ref 30.0–36.0)
MCV: 93.8 fL (ref 80.0–100.0)
Monocytes Absolute: 0.9 10*3/uL (ref 0.1–1.0)
Monocytes Relative: 9 %
Neutro Abs: 6.7 10*3/uL (ref 1.7–7.7)
Neutrophils Relative %: 71 %
Platelets: 354 10*3/uL (ref 150–400)
RBC: 3.54 MIL/uL — ABNORMAL LOW (ref 4.22–5.81)
RDW: 15.4 % (ref 11.5–15.5)
WBC: 9.4 10*3/uL (ref 4.0–10.5)
nRBC: 0 % (ref 0.0–0.2)

## 2021-07-27 LAB — COMPREHENSIVE METABOLIC PANEL
ALT: 15 U/L (ref 0–44)
AST: 32 U/L (ref 15–41)
Albumin: 3.4 g/dL — ABNORMAL LOW (ref 3.5–5.0)
Alkaline Phosphatase: 48 U/L (ref 38–126)
Anion gap: 9 (ref 5–15)
BUN: 26 mg/dL — ABNORMAL HIGH (ref 8–23)
CO2: 24 mmol/L (ref 22–32)
Calcium: 8.7 mg/dL — ABNORMAL LOW (ref 8.9–10.3)
Chloride: 102 mmol/L (ref 98–111)
Creatinine, Ser: 1.81 mg/dL — ABNORMAL HIGH (ref 0.61–1.24)
GFR, Estimated: 38 mL/min — ABNORMAL LOW (ref >60)
Glucose, Bld: 131 mg/dL — ABNORMAL HIGH (ref 70–99)
Potassium: 5.7 mmol/L — ABNORMAL HIGH (ref 3.5–5.1)
Sodium: 135 mmol/L (ref 135–145)
Total Bilirubin: 1.2 mg/dL (ref 0.3–1.2)
Total Protein: 7.4 g/dL (ref 6.5–8.1)

## 2021-07-27 LAB — URINALYSIS, ROUTINE W REFLEX MICROSCOPIC
Bacteria, UA: NONE SEEN
Bilirubin Urine: NEGATIVE
Glucose, UA: NEGATIVE mg/dL
Hgb urine dipstick: NEGATIVE
Ketones, ur: 5 mg/dL — AB
Leukocytes,Ua: NEGATIVE
Nitrite: NEGATIVE
Protein, ur: >=300 mg/dL — AB
Specific Gravity, Urine: 1.015 (ref 1.005–1.030)
pH: 6 (ref 5.0–8.0)

## 2021-07-27 MED ORDER — SODIUM CHLORIDE 0.9 % IV BOLUS
1000.0000 mL | Freq: Once | INTRAVENOUS | Status: AC
  Administered 2021-07-27: 1000 mL via INTRAVENOUS

## 2021-07-27 MED ORDER — LOKELMA 10 G PO PACK: 10.0000 g | PACK | Freq: Once | ORAL | 0 refills | Status: AC

## 2021-07-27 MED ORDER — HYDROMORPHONE HCL 1 MG/ML IJ SOLN
1.0000 mg | Freq: Once | INTRAMUSCULAR | Status: DC
  Filled 2021-07-27: qty 1

## 2021-07-27 MED ORDER — HYDROMORPHONE HCL 1 MG/ML IJ SOLN
1.0000 mg | Freq: Once | INTRAMUSCULAR | Status: AC
  Administered 2021-07-27: 1 mg via INTRAVENOUS
  Filled 2021-07-27: qty 1

## 2021-07-27 MED ORDER — HYDROMORPHONE HCL 1 MG/ML IJ SOLN
1.0000 mg | Freq: Once | INTRAMUSCULAR | Status: AC
  Administered 2021-07-27: 1 mg via INTRAMUSCULAR
  Filled 2021-07-27: qty 1

## 2021-07-27 NOTE — ED Notes (Signed)
Dietary called at this time for lunch tray.

## 2021-07-27 NOTE — ED Provider Notes (Addendum)
[facility] EMERGENCY DEPARTMENT Provider Note   CSN: [id number] Arrival date & time: 07/27/21  0920     History  Chief Complaint  Patient presents with   Back Pain    Alec Larson is a 76 y.o. male.  Patient complains chief complaint of lower back pain and left hip pain.  Symptoms ongoing since around January or February progressively worsening over the course the last several months worse in the last week per family.  No reports of any new fall or trauma.  Patient denies any fevers or cough no vomiting or diarrhea.  He is followed by his primary care doctor at [facility], has a referral to a spine specialist pending appointment.  Takes oxycodone at home without significant improvement recently and presents to ER.  Family states he has a hard time walking secondary to the pain.       Home Medications Prior to Admission medications   Medication Sig Start Date End Date Taking? Authorizing Provider  atorvastatin (LIPITOR) 40 MG tablet Take 40 mg by mouth daily. 12/28/18  Yes [provider]  azelastine (OPTIVAR) 0.05 % ophthalmic solution Place 1 drop into both eyes 2 (two) times daily as needed (itching/swelling eyes. (allergies)).  08/22/18  Yes [provider]  benazepril (LOTENSIN) 40 MG tablet Take 40 mg by mouth daily in the afternoon.  12/28/18  Yes [provider]  Cholecalciferol 250 MCG (10000 UT) CAPS Take 1 capsule by mouth daily.   Yes [provider]  Coenzyme Q10 ([employer] COQ-10) 200 MG capsule Take 200 mg by mouth daily.   Yes [provider]  diltiazem (CARDIZEM CD) 300 MG 24 hr capsule Take 300 mg by mouth daily. 03/11/21  Yes [provider]  DULoxetine (CYMBALTA) 60 MG capsule Take 60 mg by mouth daily. 12/28/18  Yes [provider]  ELIQUIS 5 MG TABS tablet Take 5 mg by mouth 2 (two) times daily. 04/21/21  Yes [provider]  fluticasone (FLONASE) 50 MCG/ACT nasal spray Place 2 sprays into both nostrils  daily as needed (allergies.).    Yes [provider]  gabapentin (NEURONTIN) 300 MG capsule Take 300-600 mg by mouth See admin instructions. Take 2 capsules (600 mg) in the morning, 1 capsule (300 mg) by mouth at noon, & 2 capsules (600 mg) by mouth at night 12/28/18  Yes [provider]  HUMULIN 70/30 KWIKPEN (70-30) 100 UNIT/ML KwikPen Inject 50-60 Units into the skin in the morning and at bedtime. Inject 50 units into the skin in the morning and 65 units into the skin at dinner. 07/18/21  Yes [provider]  isosorbide dinitrate (ISORDIL) 10 MG tablet Take 10 mg by mouth 3 (three) times daily. 04/21/21  Yes [provider]  loperamide (IMODIUM) 2 MG capsule Take 1 capsule (2 mg total) by mouth daily with breakfast. Patient taking differently: Take 2 mg by mouth daily as needed for diarrhea or loose stools. 03/19/19  Yes Rehman, Mechele Dawley, MD  Multiple Vitamins-Minerals (MULTIVITAMIN ADULTS 50+) TABS Take 1 tablet by mouth daily.   Yes [provider]  nitrofurantoin (MACRODANTIN) 50 MG capsule Take 1 capsule (50 mg total) by mouth at bedtime. 04/30/21  Yes McKenzie, Candee Furbish, MD  Omega-3 Fatty Acids (FISH OIL) 1200 MG CAPS Take 1 capsule by mouth in the morning and at bedtime.   Yes [provider]  omeprazole (PRILOSEC) 20 MG capsule Take 20 mg by mouth daily before breakfast.  12/28/18  Yes [provider]  oxyCODONE (OXY IR/ROXICODONE) 5 MG immediate release tablet Take 1-3 tablets by mouth every 4 (four) hours as needed for pain. 04/10/21  Yes [provider]  silodosin (RAPAFLO) 8 MG CAPS capsule Take 1 capsule (8 mg total) by mouth at bedtime. 04/30/21  Yes McKenzie, Candee Furbish, MD  sodium zirconium cyclosilicate (LOKELMA) 10 g PACK packet Take 10 g by mouth once for 1 dose. 07/27/21 07/27/21 Yes Luna Fuse, MD  spironolactone (ALDACTONE) 50 MG tablet Take 50 mg by mouth daily. 06/12/21  Yes [provider]   sulfamethoxazole-trimethoprim (BACTRIM DS) 800-160 MG tablet Take 1 tablet by mouth 2 (two) times daily for 7 days. 07/21/21 07/28/21 Yes Horton, Alvin Critchley, DO  traMADol (ULTRAM) 50 MG tablet Take 1 tablet (50 mg total) by mouth every 12 (twelve) hours as needed. 07/21/21  Yes Horton, Kristie M, DO  TRULICITY 1.5 IO/2.7OJ SOPN Inject 1.5 mg into the skin once a week. Wednesdays. 05/17/21  Yes [provider]  vitamin B-12 (CYANOCOBALAMIN) 1000 MCG tablet Take 1,000 mcg by mouth daily.   Yes [provider]  vitamin E 180 MG (400 UNITS) capsule Take 400 Units by mouth daily.   Yes [provider]  zinc gluconate 50 MG tablet Take 50 mg by mouth daily.   Yes [provider]      Allergies    Aspirin, Bee venom, and Penicillins    Review of Systems   Review of Systems  Constitutional:  Negative for fever.  HENT:  Negative for ear pain and sore throat.   Eyes:  Negative for pain.  Respiratory:  Negative for cough.   Cardiovascular:  Negative for chest pain.  Gastrointestinal:  Negative for abdominal pain.  Genitourinary:  Negative for flank pain.  Musculoskeletal:  Positive for back pain.  Skin:  Negative for color change and rash.  Neurological:  Negative for syncope.  All other systems reviewed and are negative.   Physical Exam Updated Vital Signs BP 133/87   Pulse 87   Temp 98.3 F (36.8 C) (Oral)   Resp 17   Ht '5\' 4"'$  (1.626 m)   Wt 102 kg   SpO2 94%   BMI 38.60 kg/m  Physical Exam Constitutional:      Appearance: He is well-developed.  HENT:     Head: Normocephalic.     Nose: Nose normal.  Eyes:     Extraocular Movements: Extraocular movements intact.  Cardiovascular:     Rate and Rhythm: Normal rate.  Pulmonary:     Effort: Pulmonary effort is normal.  Musculoskeletal:     Comments: Neurovascular intact bilateral lower extremities.  Pitting edema 1+ bilateral lower extremities no abnormal warmth or cellulitis noted.  Pain with  attempted range of motion of the left hip active or passive.  No T or C or L-spine tenderness noted.  Skin:    Coloration: Skin is not jaundiced.  Neurological:     Mental Status: He is alert. Mental status is at baseline.     ED Results / Procedures / Treatments   Labs (all labs ordered are listed, but only abnormal results are displayed) Labs Reviewed  CBC WITH DIFFERENTIAL/PLATELET - Abnormal; Notable for the following components:      Result Value   RBC 3.54 (*)    Hemoglobin 10.4 (*)    HCT 33.2 (*)    Abs Immature Granulocytes 0.12 (*)    All other components within normal limits  COMPREHENSIVE METABOLIC PANEL - Abnormal;  Notable for the following components:   Potassium 5.7 (*)    Glucose, Bld 131 (*)    BUN 26 (*)    Creatinine, Ser 1.81 (*)    Calcium 8.7 (*)    Albumin 3.4 (*)    GFR, Estimated 38 (*)    All other components within normal limits  URINALYSIS, ROUTINE W REFLEX MICROSCOPIC - Abnormal; Notable for the following components:   Ketones, ur 5 (*)    Protein, ur >=300 (*)    All other components within normal limits    EKG None  Radiology MR LUMBAR SPINE WO CONTRAST  Result Date: 07/27/2021 CLINICAL DATA:  Low back and left hip pain and leg weakness EXAM: MRI THORACIC AND LUMBAR SPINE WITHOUT CONTRAST TECHNIQUE: Multiplanar and multiecho pulse sequences of the thoracic and lumbar spine were obtained without intravenous contrast. COMPARISON:  None Available. FINDINGS: MRI THORACIC SPINE FINDINGS Alignment:  No significant listhesis. Vertebrae: Mid and lower thoracic spine degenerative endplate irregularity. Vertebral body heights are otherwise maintained. There is no substantial marrow edema. No suspicious osseous lesion. Partially imaged lower cervical postoperative changes. Cord:  No abnormal signal. Paraspinal and other soft tissues: Unremarkable Disc levels: Multilevel degenerative disc disease with disc bulges and protrusions primarily from T3-T4  through T10-T11. Facet arthropathy with ligamentum flavum thickening. A disc bulge with endplate osteophytic ridging and facet arthropathy at T10-T11 causes moderate canal narrowing. There is mild right and moderate to marked left foraminal narrowing at this level. MRI LUMBAR SPINE FINDINGS Segmentation:  Standard. Alignment:  Multilevel degenerative listhesis. Vertebrae: Multilevel degenerative endplate irregularity. Vertebral body heights are otherwise maintained. No marrow edema. No suspicious osseous lesion. Conus medullaris and cauda equina: Conus extends to the L1 level. Conus and cauda equina appear normal. Paraspinal and other soft tissues: Multiple right renal cysts are partially imaged. Disc levels: L1-L2: Disc bulge with endplate osteophytic ridging and superimposed right central protrusion. Minor canal stenosis. Partial effacement of the right subarticular recess. No foraminal stenosis. L2-L3: Disc bulge with endplate osteophytic ridging and superimposed right central/subarticular extrusion extending below disc level. Mild facet arthropathy with ligamentum flavum infolding. Moderate canal stenosis. Partial effacement of subarticular recesses. Moderate right and mild left foraminal stenosis. L3-L4: Disc bulge with endplate osteophytic ridging. Moderate facet arthropathy with ligamentum flavum infolding. Marked canal stenosis with effacement of the subarticular recesses. Mild to moderate right and moderate to marked left foraminal stenosis. L4-L5: Disc bulge with central annular fissure and protrusion. Moderate facet arthropathy with ligamentum flavum infolding. Mild canal stenosis. Partial effacement of subarticular recesses. Moderate foraminal stenosis. L5-S1: Disc bulge with endplate osteophytic ridging and superimposed central protrusion. Mild facet arthropathy. No canal stenosis. Slight effacement of subarticular recesses. Mild right and mild to moderate left foraminal stenosis. IMPRESSION: Multilevel  degenerative changes as detailed above. Of note, there is canal and left foraminal narrowing at T10-T11. Lumbar spinal stenosis is greatest at L3-L4 with effacement of subarticular recesses. Left lumbar foraminal narrowing is also greatest at this level. Electronically Signed   By: Macy Mis M.D.   On: 07/27/2021 14:29   MR THORACIC SPINE WO CONTRAST  Result Date: 07/27/2021 CLINICAL DATA:  Low back and left hip pain and leg weakness EXAM: MRI THORACIC AND LUMBAR SPINE WITHOUT CONTRAST TECHNIQUE: Multiplanar and multiecho pulse sequences of the thoracic and lumbar spine were obtained without intravenous contrast. COMPARISON:  None Available. FINDINGS: MRI THORACIC SPINE FINDINGS Alignment:  No significant listhesis. Vertebrae: Mid and lower thoracic spine degenerative endplate irregularity. Vertebral body heights  are otherwise maintained. There is no substantial marrow edema. No suspicious osseous lesion. Partially imaged lower cervical postoperative changes. Cord:  No abnormal signal. Paraspinal and other soft tissues: Unremarkable Disc levels: Multilevel degenerative disc disease with disc bulges and protrusions primarily from T3-T4 through T10-T11. Facet arthropathy with ligamentum flavum thickening. A disc bulge with endplate osteophytic ridging and facet arthropathy at T10-T11 causes moderate canal narrowing. There is mild right and moderate to marked left foraminal narrowing at this level. MRI LUMBAR SPINE FINDINGS Segmentation:  Standard. Alignment:  Multilevel degenerative listhesis. Vertebrae: Multilevel degenerative endplate irregularity. Vertebral body heights are otherwise maintained. No marrow edema. No suspicious osseous lesion. Conus medullaris and cauda equina: Conus extends to the L1 level. Conus and cauda equina appear normal. Paraspinal and other soft tissues: Multiple right renal cysts are partially imaged. Disc levels: L1-L2: Disc bulge with endplate osteophytic ridging and superimposed  right central protrusion. Minor canal stenosis. Partial effacement of the right subarticular recess. No foraminal stenosis. L2-L3: Disc bulge with endplate osteophytic ridging and superimposed right central/subarticular extrusion extending below disc level. Mild facet arthropathy with ligamentum flavum infolding. Moderate canal stenosis. Partial effacement of subarticular recesses. Moderate right and mild left foraminal stenosis. L3-L4: Disc bulge with endplate osteophytic ridging. Moderate facet arthropathy with ligamentum flavum infolding. Marked canal stenosis with effacement of the subarticular recesses. Mild to moderate right and moderate to marked left foraminal stenosis. L4-L5: Disc bulge with central annular fissure and protrusion. Moderate facet arthropathy with ligamentum flavum infolding. Mild canal stenosis. Partial effacement of subarticular recesses. Moderate foraminal stenosis. L5-S1: Disc bulge with endplate osteophytic ridging and superimposed central protrusion. Mild facet arthropathy. No canal stenosis. Slight effacement of subarticular recesses. Mild right and mild to moderate left foraminal stenosis. IMPRESSION: Multilevel degenerative changes as detailed above. Of note, there is canal and left foraminal narrowing at T10-T11. Lumbar spinal stenosis is greatest at L3-L4 with effacement of subarticular recesses. Left lumbar foraminal narrowing is also greatest at this level. Electronically Signed   By: Macy Mis M.D.   On: 07/27/2021 14:29   DG Lumbar Spine 2-3 Views  Result Date: 07/27/2021 CLINICAL DATA:  Low back pain, frequent falls EXAM: LUMBAR SPINE - 2-3 VIEW COMPARISON:  08/08/2019 FINDINGS: Five lumbar type vertebral segments. Vertebral body heights and alignment are maintained. No fracture identified. Multilevel intervertebral disc space loss with associated degenerative endplate changes. Lower lumbar facet arthrosis. Advanced abdominal aortic atherosclerosis. IMPRESSION:  Moderate multilevel lumbar spondylosis.  No acute findings. Electronically Signed   By: Davina Poke D.O.   On: 07/27/2021 12:36   DG Hip Unilat W or Wo Pelvis 2-3 Views Left  Result Date: 07/27/2021 CLINICAL DATA:  pain EXAM: DG HIP (WITH OR WITHOUT PELVIS) 2-3V LEFT COMPARISON:  None Available. FINDINGS: There is no evidence of hip fracture or dislocation. Mild bilateral hip degenerative change. Partially imaged lower lumbar degenerative change. Vascular calcifications. IMPRESSION: No evidence of acute fracture or joint malalignment. Electronically Signed   By: Margaretha Sheffield M.D.   On: 07/27/2021 12:35    Procedures Procedures    Medications Ordered in ED Medications  HYDROmorphone (DILAUDID) injection 1 mg (1 mg Intravenous Given 07/27/21 1051)  sodium chloride 0.9 % bolus 1,000 mL (1,000 mLs Intravenous New Bag/Given 07/27/21 1151)  HYDROmorphone (DILAUDID) injection 1 mg (1 mg Intravenous Given 07/27/21 1313)    ED Course/ Medical Decision Making/ A&P  Medical Decision Making Amount and/or Complexity of Data Reviewed Labs: ordered. Radiology: ordered.  Risk Prescription drug management.   Review of external records shows history of cellulitis July 21, 2021.  History obtained from fianc at bedside.  Comorbidities influencing complexity include obesity, chronic pain, chronic opioid dependence.  Diagnosis studies were sent white count of 9 hemoglobin 10 chemistry shows some renal insufficiency 1.8 creatinine and potassium 5.7.  Patient given a liter bolus of fluid resuscitation.  Given IV Dilaudid with improvement of pain.  Dates that he really does not have any low back pain but that his pain is primarily in his left hip.  We will give a prescription of Lokelma to go home with.  Patient advised repeat testing of potassium levels next week.  Patient's left hip pain does appear to be an acute exacerbation of a chronic pain issue.  Imaging is  unremarkable for acute findings.  Will advise outpatient follow-up with primary care doctor this week.  Recommending immediate return for worsening symptoms or any additional concerns.   Addendum MRI pursued showing various levels of stenosis moderate to severe.  Case discussed with on-call neurosurgery Dr.Elsner, recommending follow-up in his clinic.  Patient agreeable to plan, discharged home in stable condition.     Final Clinical Impression(s) / ED Diagnoses Final diagnoses:  Pain of left hip  Hyperkalemia  Spinal stenosis, unspecified spinal region    Rx / DC Orders ED Discharge Orders          Ordered    sodium zirconium cyclosilicate (LOKELMA) 10 g PACK packet   Once        07/27/21 1306              Luna Fuse, MD 07/27/21 1306    Luna Fuse, MD 07/27/21 1606

## 2021-07-27 NOTE — Discharge Instructions (Addendum)
Your lab tests were concerning for signs of dehydration and increased levels of potassium.  You will need to drink increased fluids at home and follow-up with your doctor within the next 2 to 3 days for repeat lab test.  I provided a phone number for Neurosurgical specialist for you to call to make an appointment.  Call your primary care doctor or specialist as discussed in the next 2-3 days.   Return immediately back to the ER if:  Your symptoms worsen within the next 12-24 hours. You develop new symptoms such as new fevers, persistent vomiting, new pain, shortness of breath, or new weakness or numbness, or if you have any other concerns.

## 2021-07-27 NOTE — ED Triage Notes (Signed)
Pt c/o lower back pain and left hip pain; pt denies any obvious injury

## 2021-07-30 ENCOUNTER — Ambulatory Visit: Payer: Medicare Other | Admitting: Urology

## 2021-07-30 DIAGNOSIS — R339 Retention of urine, unspecified: Secondary | ICD-10-CM

## 2021-07-30 DIAGNOSIS — N390 Urinary tract infection, site not specified: Secondary | ICD-10-CM

## 2021-07-30 DIAGNOSIS — N401 Enlarged prostate with lower urinary tract symptoms: Secondary | ICD-10-CM

## 2021-08-07 ENCOUNTER — Other Ambulatory Visit: Payer: Self-pay | Admitting: Neurological Surgery

## 2021-08-22 ENCOUNTER — Other Ambulatory Visit: Payer: Self-pay

## 2021-08-22 ENCOUNTER — Encounter (HOSPITAL_COMMUNITY): Payer: Self-pay | Admitting: Neurological Surgery

## 2021-08-22 NOTE — Progress Notes (Signed)
Anesthesia Chart Review: Alec Larson  Case: [id number] Date/Time: 08/23/21 0715   Procedure: T10-11, L3-4 Sublaminar decompression (Back)   Anesthesia type: General   Pre-op diagnosis: Thoracic myelopathy, Degenerative lumbar spinal stenosis   Location: [facility] OR ROOM 19 / [place] [facility]   Surgeons: Kristeen Miss, MD       DISCUSSION: Patient is a 76 year old male scheduled for the above procedure.  History includes former smoker, afib (with RVR 05/02/20 in setting of sepsis of unclear etiology, +shingles), murmur/aortic stenosis (moderate 08/10/21), CAD (diffuse aortic and coronary atherosclerosis 08/08/19 CT; intermediate stress test suggestive old inferior infarct with mild peri-infarct ischemic 06/02/20), HTN, hypercholesterolemia, DM2, GERD, anxiety, BPH (s/p UroLift devices 01/20/19), Klebsiella urosepsis (10/2019), spinal surgery (anterior C6 corpectomy with reconstructions 01/22/10), obesity.  He had cardiology preoperative risk assessment on 08/20/21 with Dr. Candis Musa [facility]). Patient first established with cardiology in late March 2022 when he developed afib with RVR during admission for sepsis of unclear etiology. EKG reported showed ST segment depression when he was in RVR. Echo then showed normal LVEF, moderate AS. He was started on Eliquis. Out-patient stress test done in April 2022 and was intermediate risk showing large inferior wall defect suggestive of prior MI with mild peri-infarct ischemia, normal EF and wall motion. Patient was asymptomatic and has been managed medically for presumed atherosclerotic CAD. At 08/20/21 visit, EKG showed SR. Recnet echo 08/10/21 showed normal LVEF, stable moderate AS. No clinical signs of HF. Continue Cardizem un-interrupted for surgery. Notes patient's Eliquis held starting 08/19/21 and asked him to resume ASAP after surgery. Known mild peri-infarct ischemia on 05/2020 stress which has been managed medically. No anginal symptoms. Continue statin. In regards to surgery, Dr.  Candis Musa wrote, "RCRI risk is high; he has elected to proceed with surgery and already discussed with his surgeon; accepts risks."  08/20/21 EKG requested from [facility]. Last BMET on 08/07/21. Preoperative labs as indicated on arrival.  Anesthesia team to evaluate on the day of surgery.   VS: 08/20/21 [facility]):  Vital Sign Reading Time Taken Comments  Blood Pressure 128/62 08/20/2021 3:14 PM EDT    Pulse 84 08/20/2021 3:14 PM EDT    Temperature 36.9 C (98.5 F) 08/20/2021 3:14 PM EDT    Respiratory Rate 18 08/20/2021 3:14 PM EDT    Oxygen Saturation 95% 08/20/2021 3:14 PM EDT    Inhaled Oxygen Concentration - -    Weight 97.6 kg (215 lb 3.2 oz) 08/20/2021 3:14 PM EDT    Height 160 cm ('5\' 3"'$ ) 08/20/2021 3:14 PM EDT    Body Mass Index 38.12 08/20/2021 3:14 PM EDT     PROVIDERS: Alfonso Ramus, MD is PCP  Cathie Hoops, MD is cardiologist [facility]) Nicolette Bang, MD is urologist   LABS: Labs as indicated on the day of surgery. As of 08/07/21 [facility]), Na 139, K 4.6, BUN 28, Cr 1.38, glucose 143. H/H 11.5/36.4, PLT 328K on 03/01/21 and A1c 7.3% 12/28/20 ([facility]).     Sleep Study/NPSG 08/13/18: IMPRESSIONS 1. Moderate obstructive sleep apnea is documented with this study. AutoPAP 8-14 is recommended. 2. 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 3. Abnormal sleep architecture is noted with absent slow wave sleep, absent REM sleep and reduced sleep efficiency.   IMAGES: MRI T/L Spine 07/27/21: IMPRESSION: Multilevel degenerative changes as detailed above. Of note, there is canal and left foraminal narrowing at T10-T11. Lumbar spinal stenosis is greatest at L3-L4 with effacement of subarticular recesses. Left lumbar foraminal  narrowing is also greatest at this level.   1V CXR 05/14/21 [facility]): FINDINGS:  The heart size and mediastinal contours are within normal limits.  Both lungs are clear. The visualized skeletal structures are   unremarkable. IMPRESSION: No active disease.   EKG: 08/20/21: NSR (per office note by Dr. Candis Musa). Tracing requested.    CV: Echo 08/10/21 [facility]): Summary  1. The left ventricle is normal in size with mildly increased wall  thickness.  2. The left ventricular systolic function is normal, LVEF is visually  estimated at 65-70%.  3. There is grade I diastolic dysfunction (impaired relaxation).  4. There is moderate aortic valve stenosis.  The aortic valve is trileaflet with moderately thickened leaflets with  moderately reduced excursion. There is trivial aortic regurgitation.  Peak AV transvalvular velocity:  3.6 m/s.  Mean gradient: 24 mmHg.  Doppler velocity index: 0.30.  Estimated aortic valve area (VTI): 1.0 cm2.  Estimated aortic valve area (velocity): 0.8 cm2.  LVOT diameter: 1.9 cm.  LV stroke volume index: 33.1 ml/m2.  5. The left atrium is moderately dilated in size.  6. The right ventricle is normal in size, with normal systolic function.    Nuclear stress test 06/02/20 [facility]): 1. Large fixed inferior defect is noted consistent with old  infarction, although mild peri-infarct reversibility or ischemia is  noted.  2. Normal left ventricular wall motion.  3. Left ventricular ejection fraction 72%  4. Non invasive risk stratification*: Intermediate  *2012 Appropriate Use Criteria for Coronary Revascularization  Focused Update: J Am Coll Cardiol. 9798;92(1):194-174.  http://content.airportbarriers.com.aspx?articleid=[email or web address]     Past Medical History:  Diagnosis Date   Anxiety    Aortic stenosis    moderate AS (08/10/21 echo)   BPH (benign prostatic hyperplasia)    Coronary artery disease    diffuse aortic and coronary atherosclerosis 08/08/19 CT; intermediate stress test suggestive old inferior infarct with mild peri-infarct ischemia, EF 72% 06/02/20, medical therapy (08/20/21, Dr. Candis Musa)   Diabetes mellitus without complication ([place])    Dysrhythmia    GERD  (gastroesophageal reflux disease)    H/O renal calculi    Heart murmur    Hypercholesteremia    Hypertension    PAF (paroxysmal atrial fibrillation) ([place])    afib with RVR 05/02/20 in setting of sepsus of unclear etiology    Past Surgical History:  Procedure Laterality Date   BIOPSY  03/19/2019   Procedure: BIOPSY;  Surgeon: Rogene Houston, MD;  Location: [facility] ENDO SUITE;  Service: Endoscopy;;   CERVICAL SPINE SURGERY     COLONOSCOPY N/A 03/19/2019   Procedure: COLONOSCOPY;  Surgeon: Rogene Houston, MD;  Location: [facility] ENDO SUITE;  Service: Endoscopy;  Laterality: N/A;  [place] N/A 01/20/2019   Procedure: CYSTOSCOPY WITH INSERTION OF UROLIFT;  Surgeon: Cleon Gustin, MD;  Location: [facility] ORS;  Service: Urology;  Laterality: N/A;  30 MINS   ESOPHAGOGASTRODUODENOSCOPY N/A 03/19/2019   Procedure: ESOPHAGOGASTRODUODENOSCOPY (EGD);  Surgeon: Rogene Houston, MD;  Location: [facility] ENDO SUITE;  Service: Endoscopy;  Laterality: N/A;  730   POLYPECTOMY  03/19/2019   Procedure: POLYPECTOMY;  Surgeon: Rogene Houston, MD;  Location: [facility] ENDO SUITE;  Service: Endoscopy;;   TONSILLECTOMY     age 54    MEDICATIONS: No current facility-administered medications for this encounter.    atorvastatin (LIPITOR) 40 MG tablet   benazepril (LOTENSIN) 40 MG tablet   Calcium Carb-Cholecalciferol (CALCIUM + D3 PO)   CHOLECALCIFEROL  PO   Coenzyme Q10 ([employer] COQ-10 PO)   diltiazem (CARDIZEM CD) 300 MG 24 hr capsule   DULoxetine (CYMBALTA) 60 MG capsule   ELIQUIS 5 MG TABS tablet   EPINEPHrine (EPIPEN 2-PAK) 0.3 mg/0.3 mL IJ SOAJ injection   fluticasone (FLONASE) 50 MCG/ACT nasal spray   folic acid (FOLVITE) 967 MCG tablet   gabapentin (NEURONTIN) 300 MG capsule   HUMULIN 70/30 KWIKPEN (70-30) 100 UNIT/ML KwikPen   isosorbide dinitrate (ISORDIL) 10 MG tablet   Multiple Vitamins-Minerals (MULTIVITAMIN ADULTS 50+) TABS   nitrofurantoin (MACRODANTIN) 50 MG capsule   Omega-3 Fatty  Acids (FISH OIL PO)   omeprazole (PRILOSEC) 20 MG capsule   oxyCODONE-acetaminophen (PERCOCET) 10-325 MG tablet   silodosin (RAPAFLO) 8 MG CAPS capsule   spironolactone (ALDACTONE) 50 MG tablet   traMADol (ULTRAM) 50 MG tablet   TRULICITY 1.5 RF/1.6BW SOPN   vitamin B-12 (CYANOCOBALAMIN) 1000 MCG tablet   zinc gluconate 50 MG tablet    Myra Gianotti, PA-C Surgical Short Stay/Anesthesiology [facility] Phone [phone] [facility] Phone [phone] 08/22/2021 12:44 PM

## 2021-08-22 NOTE — Pre-Procedure Instructions (Signed)
[place], [place] [address] [place] [address] Phone: [phone] Fax: [phone]   PCP - Dr. Areta Haber [facility] Cardiologist - Dr. Jodelle Gross [place]  Chest x-ray - 05/14/21 EKG - 08/21/21 requested from Dr. Elwyn Reach office Stress Test - 06/02/20 ECHO - 08/10/21  Checks Blood Sugar 1/day  Blood Thinner Instructions: Eliquis last dose: 08/18/21  ERAS Protcol - Clears until 0430  Anesthesia review: Y  Patient verbally denies any shortness of breath, fever, cough and chest pain during phone call   -------------  SDW INSTRUCTIONS given:  Your procedure is scheduled on 08/23/21.  Report to [facility] Main Entrance "A" at 0530 A.M., and check in at the Admitting office.  Call this number if you have problems the morning of surgery:  [phone]   Remember:  Do not eat after midnight the night before your surgery  You may drink clear liquids until 0430 the morning of your surgery.   Clear liquids allowed are: Water, Non-Citrus Juices (without pulp), Carbonated Beverages, Clear Tea, Black Coffee Only, and Gatorade    Take these medicines the morning of surgery with A SIP OF WATER  atorvastatin (LIPITOR)  diltiazem (CARDIZEM CD) DULoxetine (CYMBALTA)  fluticasone (FLONASE)  gabapentin (NEURONTIN)  omeprazole (PRILOSEC) oxyCODONE-acetaminophen (PERCOCET)-if needed traMADol (ULTRAM)-if needed   ** PLEASE check your blood sugar the morning of your surgery when you wake up and every 2 hours until you get to the Short Stay unit.  If your blood sugar is less than 70 mg/dL, you will need to treat for low blood sugar: Do not take insulin. Treat a low blood sugar (less than 70 mg/dL) with  cup of clear juice (cranberry or apple), 4 glucose tablets, OR glucose gel. Recheck blood sugar in 15 minutes after treatment (to make sure it is greater than 70 mg/dL). If your blood sugar is not greater than 70 mg/dL on recheck, call [phone] for  further instructions.   As of today, STOP taking any Aspirin (unless otherwise instructed by your surgeon) Aleve, Naproxen, Ibuprofen, Motrin, Advil, Goody's, BC's, all herbal medications, fish oil, and all vitamins.                      Do not wear jewelry, make up, or nail polish            Do not wear lotions, powders, perfumes/colognes, or deodorant.            Do not shave 48 hours prior to surgery.  Men may shave face and neck.            Do not bring valuables to the hospital.            [facility] is not responsible for any belongings or valuables.  Do NOT Smoke (Tobacco/Vaping) 24 hours prior to your procedure If you use a CPAP at night, you may bring all equipment for your overnight stay.   Contacts, glasses, dentures or bridgework may not be worn into surgery.      For patients admitted to the hospital, discharge time will be determined by your treatment team.   Patients discharged the day of surgery will not be allowed to drive home, and someone needs to stay with them for 24 hours.    Special instructions:   [place]- Preparing For Surgery  Before surgery, you can play an important role. Because skin is not sterile, your skin needs to be as free  of germs as possible. You can reduce the number of germs on your skin by washing with CHG (chlorahexidine gluconate) Soap before surgery.  CHG is an antiseptic cleaner which kills germs and bonds with the skin to continue killing germs even after washing.    Oral Hygiene is also important to reduce your risk of infection.  Remember - BRUSH YOUR TEETH THE MORNING OF SURGERY WITH YOUR REGULAR TOOTHPASTE  Please do not use if you have an allergy to CHG or antibacterial soaps. If your skin becomes reddened/irritated stop using the CHG.  Do not shave (including legs and underarms) for at least 48 hours prior to first CHG shower. It is OK to shave your face.  Please follow these instructions carefully.   Shower the NIGHT BEFORE  SURGERY and the MORNING OF SURGERY with DIAL Soap.   Pat yourself dry with a CLEAN TOWEL.  Wear CLEAN PAJAMAS to bed the night before surgery  Place CLEAN SHEETS on your bed the night of your first shower and DO NOT SLEEP WITH PETS.   Day of Surgery: Please shower morning of surgery  Wear Clean/Comfortable clothing the morning of surgery Do not apply any deodorants/lotions.   Remember to brush your teeth WITH YOUR REGULAR TOOTHPASTE.   Questions were answered. Patient verbalized understanding of instructions.

## 2021-08-22 NOTE — Anesthesia Preprocedure Evaluation (Addendum)
Anesthesia Evaluation  Patient identified by MRN, date of birth, ID band Patient awake    Reviewed: Allergy & Precautions, NPO status , Patient's Chart, lab work & pertinent test results  History of Anesthesia Complications Negative for: history of anesthetic complications  Airway Mallampati: I  TM Distance: >3 FB Neck ROM: Full    Dental  (+) Poor Dentition, Missing, Dental Advisory Given, Edentulous Upper   Pulmonary former smoker,    breath sounds clear to auscultation       Cardiovascular hypertension, Pt. on medications + dysrhythmias Atrial Fibrillation + Valvular Problems/Murmurs (moderate) AS  Rhythm:Irregular Rate:Normal + Systolic murmurs Echo 05/09/49 [facility]): Summary  1. The left ventricle is normal in size with mildly increased wall  thickness.  2. The left ventricular systolic function is normal, LVEF is visually  estimated at 65-70%.  3. There is grade I diastolic dysfunction (impaired relaxation).  4. There is moderate aortic valve stenosis.  The aortic valve is trileaflet with moderately thickened leaflets with  moderately reduced excursion. There is trivial aortic regurgitation.  Peak AV transvalvular velocity: 3.6 m/s.  Mean gradient: 24 mmHg.  Doppler velocity index: 0.30.  Estimated aortic valve area (VTI): 1.0 cm2.  Estimated aortic valve area (velocity): 0.8 cm2.  LVOT diameter: 1.9 cm.  LV stroke volume index: 33.1 ml/m2.  5. The left atrium is moderately dilated in size.  6. The right ventricle is normal in size, with normal systolic function.   '22 Stress: intermediate risk showing large inferior wall defect suggestive of prior MI with mild peri-infarct ischemia, normal EF and wall motion  Cardiology:  "RCRI risk is high; he has elected to proceed with surgery and already discussed with his surgeon; accepts risks."   Neuro/Psych Wheelchair bound    GI/Hepatic Neg liver ROS, GERD  Medicated  and Controlled,  Endo/Other  diabetes (glu 141), Insulin Dependent  Renal/GU negative Renal ROS     Musculoskeletal   Abdominal (+) + obese,   Peds  Hematology Eliquis: last dose sun   Anesthesia Other Findings   Reproductive/Obstetrics                           Anesthesia Physical Anesthesia Plan  ASA: 4  Anesthesia Plan: General   Post-op Pain Management: Tylenol PO (pre-op)*   Induction: Intravenous  PONV Risk Score and Plan: 2 and Ondansetron and Dexamethasone  Airway Management Planned: Oral ETT  Additional Equipment: Arterial line  Intra-op Plan:   Post-operative Plan: Extubation in OR  Informed Consent: I have reviewed the patients History and Physical, chart, labs and discussed the procedure including the risks, benefits and alternatives for the proposed anesthesia with the patient or authorized representative who has indicated his/her understanding and acceptance.     Dental advisory given  Plan Discussed with: CRNA and Surgeon  Anesthesia Plan Comments: (PAT note written 08/22/2021 by Myra Gianotti, PA-C. Pt and fiance understand increased risk of CVA, MI, death periop, but pt cannot ambulate and wishes to proceed)     Anesthesia Quick Evaluation

## 2021-08-23 ENCOUNTER — Ambulatory Visit (HOSPITAL_COMMUNITY): Payer: Medicare Other

## 2021-08-23 ENCOUNTER — Observation Stay (HOSPITAL_COMMUNITY)
Admission: RE | Admit: 2021-08-23 | Discharge: 2021-08-25 | Disposition: A | Discharge status: Home Health | Payer: Medicare Other | Attending: Neurological Surgery | Admitting: Neurological Surgery

## 2021-08-23 ENCOUNTER — Encounter (HOSPITAL_COMMUNITY)
Admission: RE | Disposition: A | Discharge status: Home Health | Payer: Self-pay | Source: Home / Self Care | Attending: Neurological Surgery

## 2021-08-23 ENCOUNTER — Ambulatory Visit (HOSPITAL_COMMUNITY): Payer: Medicare Other | Admitting: Vascular Surgery

## 2021-08-23 ENCOUNTER — Other Ambulatory Visit: Payer: Self-pay

## 2021-08-23 ENCOUNTER — Encounter (HOSPITAL_COMMUNITY): Payer: Self-pay | Admitting: Neurological Surgery

## 2021-08-23 ENCOUNTER — Ambulatory Visit (HOSPITAL_BASED_OUTPATIENT_CLINIC_OR_DEPARTMENT_OTHER): Payer: Medicare Other | Admitting: Vascular Surgery

## 2021-08-23 DIAGNOSIS — M4805 Spinal stenosis, thoracolumbar region: Secondary | ICD-10-CM

## 2021-08-23 DIAGNOSIS — M4715 Other spondylosis with myelopathy, thoracolumbar region: Secondary | ICD-10-CM

## 2021-08-23 DIAGNOSIS — Z87891 Personal history of nicotine dependence: Secondary | ICD-10-CM

## 2021-08-23 DIAGNOSIS — M48062 Spinal stenosis, lumbar region with neurogenic claudication: Secondary | ICD-10-CM | POA: Insufficient documentation

## 2021-08-23 DIAGNOSIS — E119 Type 2 diabetes mellitus without complications: Secondary | ICD-10-CM | POA: Insufficient documentation

## 2021-08-23 DIAGNOSIS — I4891 Unspecified atrial fibrillation: Secondary | ICD-10-CM | POA: Insufficient documentation

## 2021-08-23 DIAGNOSIS — I10 Essential (primary) hypertension: Secondary | ICD-10-CM

## 2021-08-23 DIAGNOSIS — M4716 Other spondylosis with myelopathy, lumbar region: Secondary | ICD-10-CM | POA: Insufficient documentation

## 2021-08-23 DIAGNOSIS — M4804 Spinal stenosis, thoracic region: Secondary | ICD-10-CM | POA: Diagnosis present

## 2021-08-23 DIAGNOSIS — Z96652 Presence of left artificial knee joint: Secondary | ICD-10-CM | POA: Diagnosis not present

## 2021-08-23 DIAGNOSIS — M4714 Other spondylosis with myelopathy, thoracic region: Principal | ICD-10-CM | POA: Insufficient documentation

## 2021-08-23 DIAGNOSIS — G992 Myelopathy in diseases classified elsewhere: Secondary | ICD-10-CM | POA: Diagnosis present

## 2021-08-23 HISTORY — DX: Nonrheumatic aortic (valve) stenosis: I35.0

## 2021-08-23 HISTORY — DX: Paroxysmal atrial fibrillation: I48.0

## 2021-08-23 HISTORY — DX: Cardiac arrhythmia, unspecified: I49.9

## 2021-08-23 HISTORY — PX: LUMBAR LAMINECTOMY/DECOMPRESSION MICRODISCECTOMY: SHX5026

## 2021-08-23 HISTORY — DX: Cardiac murmur, unspecified: R01.1

## 2021-08-23 HISTORY — DX: Atherosclerotic heart disease of native coronary artery without angina pectoris: I25.10

## 2021-08-23 LAB — GLUCOSE, CAPILLARY
Glucose-Capillary: 141 mg/dL — ABNORMAL HIGH (ref 70–99)
Glucose-Capillary: 145 mg/dL — ABNORMAL HIGH (ref 70–99)
Glucose-Capillary: 186 mg/dL — ABNORMAL HIGH (ref 70–99)
Glucose-Capillary: 219 mg/dL — ABNORMAL HIGH (ref 70–99)

## 2021-08-23 LAB — SURGICAL PCR SCREEN
MRSA, PCR: NEGATIVE
Staphylococcus aureus: NEGATIVE

## 2021-08-23 SURGERY — LUMBAR LAMINECTOMY/DECOMPRESSION MICRODISCECTOMY 2 LEVELS
Anesthesia: General | Site: Back

## 2021-08-23 MED ORDER — OXYCODONE HCL 5 MG PO TABS: 5.0000 mg | ORAL_TABLET | Freq: Once | ORAL | Status: DC | PRN

## 2021-08-23 MED ORDER — FENTANYL CITRATE (PF) 250 MCG/5ML IJ SOLN
INTRAMUSCULAR | Status: AC
  Filled 2021-08-23: qty 5

## 2021-08-23 MED ORDER — CHLORHEXIDINE GLUCONATE 0.12 % MT SOLN
15.0000 mL | Freq: Once | OROMUCOSAL | Status: AC
Start: 2021-08-23 — End: 2021-08-23

## 2021-08-23 MED ORDER — DULAGLUTIDE 1.5 MG/0.5ML ~~LOC~~ SOAJ
1.5000 mg | SUBCUTANEOUS | Status: DC
Start: 2021-08-29 — End: 2021-08-23

## 2021-08-23 MED ORDER — THROMBIN 5000 UNITS EX SOLR
OROMUCOSAL | Status: DC | PRN
  Administered 2021-08-23: 5 mL via TOPICAL

## 2021-08-23 MED ORDER — CHLORHEXIDINE GLUCONATE 0.12 % MT SOLN
OROMUCOSAL | Status: AC
  Administered 2021-08-23: 15 mL via OROMUCOSAL
  Filled 2021-08-23: qty 15

## 2021-08-23 MED ORDER — LABETALOL HCL 5 MG/ML IV SOLN
INTRAVENOUS | Status: AC
Start: 2021-08-23 — End: ?
  Filled 2021-08-23: qty 4

## 2021-08-23 MED ORDER — ONDANSETRON HCL 4 MG/2ML IJ SOLN
INTRAMUSCULAR | Status: DC | PRN
  Administered 2021-08-23: 4 mg via INTRAVENOUS

## 2021-08-23 MED ORDER — VANCOMYCIN HCL IN DEXTROSE 1-5 GM/200ML-% IV SOLN
1000.0000 mg | Freq: Once | INTRAVENOUS | Status: AC
  Administered 2021-08-23: 1000 mg via INTRAVENOUS
  Filled 2021-08-23: qty 200

## 2021-08-23 MED ORDER — MIDAZOLAM HCL 2 MG/2ML IJ SOLN: 0.5000 mg | Freq: Once | INTRAMUSCULAR | Status: DC | PRN

## 2021-08-23 MED ORDER — DULOXETINE HCL 60 MG PO CPEP
60.0000 mg | ORAL_CAPSULE | Freq: Every day | ORAL | Status: DC
  Administered 2021-08-23 – 2021-08-25 (×3): 60 mg via ORAL
  Filled 2021-08-23 (×3): qty 1

## 2021-08-23 MED ORDER — ONDANSETRON HCL 4 MG PO TABS: 4.0000 mg | ORAL_TABLET | Freq: Four times a day (QID) | ORAL | Status: DC | PRN

## 2021-08-23 MED ORDER — DEXAMETHASONE SODIUM PHOSPHATE 10 MG/ML IJ SOLN
INTRAMUSCULAR | Status: AC
  Filled 2021-08-23: qty 1

## 2021-08-23 MED ORDER — ONDANSETRON HCL 4 MG/2ML IJ SOLN
INTRAMUSCULAR | Status: AC
  Filled 2021-08-23: qty 2

## 2021-08-23 MED ORDER — OXYCODONE-ACETAMINOPHEN 10-325 MG PO TABS: 1.0000 | ORAL_TABLET | Freq: Four times a day (QID) | ORAL | Status: DC | PRN

## 2021-08-23 MED ORDER — ISOSORBIDE DINITRATE 10 MG PO TABS
10.0000 mg | ORAL_TABLET | Freq: Three times a day (TID) | ORAL | Status: DC
  Administered 2021-08-23 – 2021-08-25 (×6): 10 mg via ORAL
  Filled 2021-08-23 (×8): qty 1

## 2021-08-23 MED ORDER — SODIUM CHLORIDE 0.9 % IV SOLN
250.0000 mL | INTRAVENOUS | Status: DC
  Administered 2021-08-23: 250 mL via INTRAVENOUS

## 2021-08-23 MED ORDER — SODIUM CHLORIDE 0.9% FLUSH
3.0000 mL | Freq: Two times a day (BID) | INTRAVENOUS | Status: DC
Start: 2021-08-23 — End: 2021-08-25
  Administered 2021-08-23 – 2021-08-25 (×5): 3 mL via INTRAVENOUS

## 2021-08-23 MED ORDER — CEFAZOLIN SODIUM-DEXTROSE 2-4 GM/100ML-% IV SOLN: 2.0000 g | Freq: Three times a day (TID) | INTRAVENOUS | Status: DC

## 2021-08-23 MED ORDER — SODIUM CHLORIDE 0.9% FLUSH: 3.0000 mL | INTRAVENOUS | Status: DC | PRN

## 2021-08-23 MED ORDER — DOCUSATE SODIUM 100 MG PO CAPS
100.0000 mg | ORAL_CAPSULE | Freq: Two times a day (BID) | ORAL | Status: DC
  Administered 2021-08-23 – 2021-08-25 (×4): 100 mg via ORAL
  Filled 2021-08-23 (×4): qty 1

## 2021-08-23 MED ORDER — LIDOCAINE 2% (20 MG/ML) 5 ML SYRINGE
INTRAMUSCULAR | Status: AC
  Filled 2021-08-23: qty 5

## 2021-08-23 MED ORDER — THROMBIN 5000 UNITS EX SOLR
CUTANEOUS | Status: AC
  Filled 2021-08-23: qty 5000

## 2021-08-23 MED ORDER — PROMETHAZINE HCL 25 MG/ML IJ SOLN: 6.2500 mg | INTRAMUSCULAR | Status: DC | PRN

## 2021-08-23 MED ORDER — MORPHINE SULFATE (PF) 2 MG/ML IV SOLN: 2.0000 mg | INTRAVENOUS | Status: DC | PRN

## 2021-08-23 MED ORDER — THROMBIN 5000 UNITS EX SOLR
CUTANEOUS | Status: DC | PRN
  Administered 2021-08-23: 5000 [IU] via TOPICAL

## 2021-08-23 MED ORDER — ONDANSETRON HCL 4 MG/2ML IJ SOLN
4.0000 mg | Freq: Four times a day (QID) | INTRAMUSCULAR | Status: DC | PRN
  Administered 2021-08-24: 4 mg via INTRAVENOUS
  Filled 2021-08-23: qty 2

## 2021-08-23 MED ORDER — POLYETHYLENE GLYCOL 3350 17 G PO PACK: 17.0000 g | PACK | Freq: Every day | ORAL | Status: DC | PRN

## 2021-08-23 MED ORDER — INSULIN ASPART PROT & ASPART (70-30 MIX) 100 UNIT/ML ~~LOC~~ SUSP
55.0000 [IU] | Freq: Every day | SUBCUTANEOUS | Status: DC
Start: 2021-08-23 — End: 2021-08-25
  Administered 2021-08-23 – 2021-08-24 (×2): 55 [IU] via SUBCUTANEOUS
  Filled 2021-08-23: qty 10

## 2021-08-23 MED ORDER — PHENYLEPHRINE HCL-NACL 20-0.9 MG/250ML-% IV SOLN
INTRAVENOUS | Status: DC | PRN
  Administered 2021-08-23: 25 ug/min via INTRAVENOUS
  Administered 2021-08-23: 40 ug/min via INTRAVENOUS

## 2021-08-23 MED ORDER — HYDROMORPHONE HCL 1 MG/ML IJ SOLN: 0.2500 mg | INTRAMUSCULAR | Status: DC | PRN

## 2021-08-23 MED ORDER — CHLORHEXIDINE GLUCONATE CLOTH 2 % EX PADS: 6.0000 | MEDICATED_PAD | Freq: Once | CUTANEOUS | Status: DC

## 2021-08-23 MED ORDER — BENAZEPRIL HCL 20 MG PO TABS
40.0000 mg | ORAL_TABLET | Freq: Every day | ORAL | Status: DC
  Administered 2021-08-24 – 2021-08-25 (×2): 40 mg via ORAL
  Filled 2021-08-23: qty 2
  Filled 2021-08-23: qty 1
  Filled 2021-08-23 (×3): qty 2

## 2021-08-23 MED ORDER — GABAPENTIN 300 MG PO CAPS
900.0000 mg | ORAL_CAPSULE | Freq: Every day | ORAL | Status: DC
Start: 2021-08-23 — End: 2021-08-25
  Administered 2021-08-23 – 2021-08-24 (×2): 900 mg via ORAL
  Filled 2021-08-23 (×2): qty 3

## 2021-08-23 MED ORDER — METHOCARBAMOL 1000 MG/10ML IJ SOLN: 500.0000 mg | Freq: Four times a day (QID) | INTRAVENOUS | Status: DC | PRN

## 2021-08-23 MED ORDER — LIDOCAINE 2% (20 MG/ML) 5 ML SYRINGE
INTRAMUSCULAR | Status: DC | PRN
  Administered 2021-08-23: 40 mg via INTRAVENOUS

## 2021-08-23 MED ORDER — BUPIVACAINE HCL (PF) 0.5 % IJ SOLN
INTRAMUSCULAR | Status: DC | PRN
  Administered 2021-08-23 (×2): 15 mL

## 2021-08-23 MED ORDER — THROMBIN 5000 UNITS EX SOLR
CUTANEOUS | Status: AC
Start: 2021-08-23 — End: ?
  Filled 2021-08-23: qty 5000

## 2021-08-23 MED ORDER — PHENYLEPHRINE 80 MCG/ML (10ML) SYRINGE FOR IV PUSH (FOR BLOOD PRESSURE SUPPORT)
PREFILLED_SYRINGE | INTRAVENOUS | Status: AC
  Filled 2021-08-23: qty 10

## 2021-08-23 MED ORDER — BISACODYL 10 MG RE SUPP: 10.0000 mg | Freq: Every day | RECTAL | Status: DC | PRN

## 2021-08-23 MED ORDER — VANCOMYCIN HCL IN DEXTROSE 1-5 GM/200ML-% IV SOLN
INTRAVENOUS | Status: AC
  Filled 2021-08-23: qty 200

## 2021-08-23 MED ORDER — ALUM & MAG HYDROXIDE-SIMETH 200-200-20 MG/5ML PO SUSP: 30.0000 mL | Freq: Four times a day (QID) | ORAL | Status: DC | PRN

## 2021-08-23 MED ORDER — DILTIAZEM HCL ER COATED BEADS 300 MG PO CP24
300.0000 mg | ORAL_CAPSULE | Freq: Every day | ORAL | Status: DC
  Administered 2021-08-23 – 2021-08-25 (×3): 300 mg via ORAL
  Filled 2021-08-23 (×3): qty 1

## 2021-08-23 MED ORDER — PROPOFOL 10 MG/ML IV BOLUS
INTRAVENOUS | Status: DC | PRN
  Administered 2021-08-23: 100 mg via INTRAVENOUS

## 2021-08-23 MED ORDER — BUPIVACAINE HCL (PF) 0.5 % IJ SOLN
INTRAMUSCULAR | Status: AC
  Filled 2021-08-23: qty 30

## 2021-08-23 MED ORDER — ALBUMIN HUMAN 5 % IV SOLN: INTRAVENOUS | Status: DC | PRN

## 2021-08-23 MED ORDER — PHENYLEPHRINE 80 MCG/ML (10ML) SYRINGE FOR IV PUSH (FOR BLOOD PRESSURE SUPPORT)
PREFILLED_SYRINGE | INTRAVENOUS | Status: DC | PRN
  Administered 2021-08-23 (×4): 40 ug via INTRAVENOUS

## 2021-08-23 MED ORDER — OXYCODONE HCL 5 MG/5ML PO SOLN: 5.0000 mg | Freq: Once | ORAL | Status: DC | PRN

## 2021-08-23 MED ORDER — TRAMADOL HCL 50 MG PO TABS: 50.0000 mg | ORAL_TABLET | Freq: Two times a day (BID) | ORAL | Status: DC | PRN

## 2021-08-23 MED ORDER — CLEVIDIPINE BUTYRATE 0.5 MG/ML IV EMUL
0.0000 mg/h | INTRAVENOUS | Status: DC
  Administered 2021-08-23: 1 mg/h via INTRAVENOUS

## 2021-08-23 MED ORDER — VANCOMYCIN HCL IN DEXTROSE 1-5 GM/200ML-% IV SOLN
1000.0000 mg | INTRAVENOUS | Status: AC
  Administered 2021-08-23: 1000 mg via INTRAVENOUS

## 2021-08-23 MED ORDER — FLUTICASONE PROPIONATE 50 MCG/ACT NA SUSP
2.0000 | Freq: Every day | NASAL | Status: DC
Start: 2021-08-24 — End: 2021-08-25
  Administered 2021-08-24 – 2021-08-25 (×2): 2 via NASAL
  Filled 2021-08-23: qty 16

## 2021-08-23 MED ORDER — MEPERIDINE HCL 25 MG/ML IJ SOLN: 6.2500 mg | INTRAMUSCULAR | Status: DC | PRN

## 2021-08-23 MED ORDER — LABETALOL HCL 5 MG/ML IV SOLN
INTRAVENOUS | Status: DC | PRN
  Administered 2021-08-23 (×4): 5 mg via INTRAVENOUS

## 2021-08-23 MED ORDER — ORAL CARE MOUTH RINSE: 15.0000 mL | Freq: Once | OROMUCOSAL | Status: AC

## 2021-08-23 MED ORDER — NITROFURANTOIN MACROCRYSTAL 50 MG PO CAPS
50.0000 mg | ORAL_CAPSULE | Freq: Every day | ORAL | Status: DC
Start: 2021-08-23 — End: 2021-08-25
  Administered 2021-08-23 – 2021-08-24 (×2): 50 mg via ORAL
  Filled 2021-08-23 (×3): qty 1

## 2021-08-23 MED ORDER — INSULIN ASPART PROT & ASPART (70-30 MIX) 100 UNIT/ML ~~LOC~~ SUSP
50.0000 [IU] | Freq: Every day | SUBCUTANEOUS | Status: DC
  Filled 2021-08-23: qty 10

## 2021-08-23 MED ORDER — ORAL CARE MOUTH RINSE: 15.0000 mL | OROMUCOSAL | Status: DC | PRN

## 2021-08-23 MED ORDER — 0.9 % SODIUM CHLORIDE (POUR BTL) OPTIME
TOPICAL | Status: DC | PRN
  Administered 2021-08-23: 1000 mL

## 2021-08-23 MED ORDER — PHENOL 1.4 % MT LIQD
1.0000 | OROMUCOSAL | Status: DC | PRN
Start: 2021-08-23 — End: 2021-08-25

## 2021-08-23 MED ORDER — PROPOFOL 10 MG/ML IV BOLUS
INTRAVENOUS | Status: AC
  Filled 2021-08-23: qty 20

## 2021-08-23 MED ORDER — TAMSULOSIN HCL 0.4 MG PO CAPS
0.4000 mg | ORAL_CAPSULE | Freq: Every day | ORAL | Status: DC
  Administered 2021-08-23 – 2021-08-24 (×2): 0.4 mg via ORAL
  Filled 2021-08-23 (×2): qty 1

## 2021-08-23 MED ORDER — DEXAMETHASONE SODIUM PHOSPHATE 10 MG/ML IJ SOLN
INTRAMUSCULAR | Status: DC | PRN
  Administered 2021-08-23: 6 mg via INTRAVENOUS

## 2021-08-23 MED ORDER — ROCURONIUM BROMIDE 10 MG/ML (PF) SYRINGE
PREFILLED_SYRINGE | INTRAVENOUS | Status: DC | PRN
  Administered 2021-08-23: 80 mg via INTRAVENOUS
  Administered 2021-08-23: 20 mg via INTRAVENOUS

## 2021-08-23 MED ORDER — LIDOCAINE-EPINEPHRINE 1 %-1:100000 IJ SOLN
INTRAMUSCULAR | Status: AC
  Filled 2021-08-23: qty 1

## 2021-08-23 MED ORDER — ACETAMINOPHEN 650 MG RE SUPP: 650.0000 mg | RECTAL | Status: DC | PRN

## 2021-08-23 MED ORDER — OXYCODONE HCL 5 MG PO TABS
5.0000 mg | ORAL_TABLET | Freq: Four times a day (QID) | ORAL | Status: DC | PRN
  Administered 2021-08-23 – 2021-08-24 (×2): 5 mg via ORAL
  Filled 2021-08-23 (×2): qty 1

## 2021-08-23 MED ORDER — FLEET ENEMA 7-19 GM/118ML RE ENEM: 1.0000 | ENEMA | Freq: Once | RECTAL | Status: DC | PRN

## 2021-08-23 MED ORDER — METHOCARBAMOL 500 MG PO TABS
500.0000 mg | ORAL_TABLET | Freq: Four times a day (QID) | ORAL | Status: DC | PRN
  Administered 2021-08-23: 500 mg via ORAL
  Filled 2021-08-23: qty 1

## 2021-08-23 MED ORDER — ATORVASTATIN CALCIUM 40 MG PO TABS
40.0000 mg | ORAL_TABLET | Freq: Every day | ORAL | Status: DC
  Administered 2021-08-23 – 2021-08-25 (×3): 40 mg via ORAL
  Filled 2021-08-23 (×3): qty 1

## 2021-08-23 MED ORDER — PANTOPRAZOLE SODIUM 40 MG PO TBEC
40.0000 mg | DELAYED_RELEASE_TABLET | Freq: Every day | ORAL | Status: DC
  Administered 2021-08-23 – 2021-08-25 (×3): 40 mg via ORAL
  Filled 2021-08-23 (×3): qty 1

## 2021-08-23 MED ORDER — LACTATED RINGERS IV SOLN: INTRAVENOUS | Status: DC

## 2021-08-23 MED ORDER — SPIRONOLACTONE 25 MG PO TABS
50.0000 mg | ORAL_TABLET | Freq: Every day | ORAL | Status: DC
  Administered 2021-08-24 – 2021-08-25 (×2): 50 mg via ORAL
  Filled 2021-08-23 (×2): qty 2

## 2021-08-23 MED ORDER — SENNA 8.6 MG PO TABS
1.0000 | ORAL_TABLET | Freq: Two times a day (BID) | ORAL | Status: DC
  Administered 2021-08-24 – 2021-08-25 (×3): 8.6 mg via ORAL
  Filled 2021-08-23 (×4): qty 1

## 2021-08-23 MED ORDER — ROCURONIUM BROMIDE 10 MG/ML (PF) SYRINGE
PREFILLED_SYRINGE | INTRAVENOUS | Status: AC
  Filled 2021-08-23: qty 10

## 2021-08-23 MED ORDER — OXYCODONE-ACETAMINOPHEN 5-325 MG PO TABS
1.0000 | ORAL_TABLET | Freq: Four times a day (QID) | ORAL | Status: DC | PRN
  Administered 2021-08-23: 1 via ORAL
  Filled 2021-08-23: qty 1

## 2021-08-23 MED ORDER — INSULIN ASPART PROT & ASPART (70-30 MIX) 100 UNIT/ML ~~LOC~~ SUSP
50.0000 [IU] | Freq: Every day | SUBCUTANEOUS | Status: DC
  Administered 2021-08-24: 50 [IU] via SUBCUTANEOUS

## 2021-08-23 MED ORDER — GABAPENTIN 300 MG PO CAPS
600.0000 mg | ORAL_CAPSULE | Freq: Every day | ORAL | Status: DC
  Administered 2021-08-23 – 2021-08-25 (×3): 600 mg via ORAL
  Filled 2021-08-23 (×3): qty 2

## 2021-08-23 MED ORDER — FENTANYL CITRATE (PF) 250 MCG/5ML IJ SOLN
INTRAMUSCULAR | Status: DC | PRN
  Administered 2021-08-23: 25 ug via INTRAVENOUS
  Administered 2021-08-23: 250 ug via INTRAVENOUS
  Administered 2021-08-23: 50 ug via INTRAVENOUS
  Administered 2021-08-23: 25 ug via INTRAVENOUS

## 2021-08-23 MED ORDER — INSULIN ASPART 100 UNIT/ML IJ SOLN: 0.0000 [IU] | INTRAMUSCULAR | Status: DC | PRN

## 2021-08-23 MED ORDER — SUGAMMADEX SODIUM 200 MG/2ML IV SOLN
INTRAVENOUS | Status: DC | PRN
  Administered 2021-08-23: 400 mg via INTRAVENOUS

## 2021-08-23 MED ORDER — ACETAMINOPHEN 325 MG PO TABS
650.0000 mg | ORAL_TABLET | ORAL | Status: DC | PRN
  Administered 2021-08-23: 650 mg via ORAL
  Filled 2021-08-23: qty 2

## 2021-08-23 MED ORDER — MENTHOL 3 MG MT LOZG: 1.0000 | LOZENGE | OROMUCOSAL | Status: DC | PRN

## 2021-08-23 MED ORDER — LIDOCAINE-EPINEPHRINE 1 %-1:100000 IJ SOLN
INTRAMUSCULAR | Status: DC | PRN
  Administered 2021-08-23: 20 mL

## 2021-08-23 SURGICAL SUPPLY — 48 items
BAG COUNTER SPONGE SURGICOUNT (BAG) ×2 IMPLANT
BAND RUBBER #18 3X1/16 STRL (MISCELLANEOUS) IMPLANT
BLADE CLIPPER SURG (BLADE) IMPLANT
BUR ACORN 6.0 (BURR) IMPLANT
BUR MATCHSTICK NEURO 3.0 LAGG (BURR) ×2 IMPLANT
CANISTER SUCT 3000ML PPV (MISCELLANEOUS) ×2 IMPLANT
DERMABOND ADVANCED (GAUZE/BANDAGES/DRESSINGS) ×1
DERMABOND ADVANCED .7 DNX12 (GAUZE/BANDAGES/DRESSINGS) ×1 IMPLANT
DRAPE C-ARM 42X72 X-RAY (DRAPES) ×2 IMPLANT
DRAPE HALF SHEET 40X57 (DRAPES) IMPLANT
DRAPE LAPAROTOMY 100X72X124 (DRAPES) ×2 IMPLANT
DRAPE MICROSCOPE LEICA (MISCELLANEOUS) IMPLANT
DRSG OPSITE POSTOP 4X6 (GAUZE/BANDAGES/DRESSINGS) ×2 IMPLANT
DURAPREP 26ML APPLICATOR (WOUND CARE) ×2 IMPLANT
ELECT REM PT RETURN 9FT ADLT (ELECTROSURGICAL) ×2
ELECTRODE REM PT RTRN 9FT ADLT (ELECTROSURGICAL) ×1 IMPLANT
GAUZE 4X4 16PLY ~~LOC~~+RFID DBL (SPONGE) IMPLANT
GAUZE SPONGE 4X4 12PLY STRL (GAUZE/BANDAGES/DRESSINGS) ×2 IMPLANT
GLOVE BIOGEL PI IND STRL 7.5 (GLOVE) IMPLANT
GLOVE BIOGEL PI IND STRL 8.5 (GLOVE) ×1 IMPLANT
GLOVE BIOGEL PI INDICATOR 7.5 (GLOVE) ×1
GLOVE BIOGEL PI INDICATOR 8.5 (GLOVE) ×1
GLOVE ECLIPSE 8.5 STRL (GLOVE) ×2 IMPLANT
GLOVE SURG SS PI 6.5 STRL IVOR (GLOVE) ×1 IMPLANT
GOWN STRL REUS W/ TWL LRG LVL3 (GOWN DISPOSABLE) IMPLANT
GOWN STRL REUS W/ TWL XL LVL3 (GOWN DISPOSABLE) IMPLANT
GOWN STRL REUS W/TWL 2XL LVL3 (GOWN DISPOSABLE) ×2 IMPLANT
GOWN STRL REUS W/TWL LRG LVL3 (GOWN DISPOSABLE) ×2
GOWN STRL REUS W/TWL XL LVL3 (GOWN DISPOSABLE) ×3
KIT BASIN OR (CUSTOM PROCEDURE TRAY) ×2 IMPLANT
KIT TURNOVER KIT B (KITS) ×2 IMPLANT
NDL SPNL 20GX3.5 QUINCKE YW (NEEDLE) IMPLANT
NEEDLE HYPO 22GX1.5 SAFETY (NEEDLE) ×2 IMPLANT
NEEDLE SPNL 20GX3.5 QUINCKE YW (NEEDLE) ×2 IMPLANT
NS IRRIG 1000ML POUR BTL (IV SOLUTION) ×2 IMPLANT
PACK LAMINECTOMY NEURO (CUSTOM PROCEDURE TRAY) ×2 IMPLANT
PAD ARMBOARD 7.5X6 YLW CONV (MISCELLANEOUS) ×6 IMPLANT
PATTIES SURGICAL .5 X1 (DISPOSABLE) ×2 IMPLANT
SPIKE FLUID TRANSFER (MISCELLANEOUS) ×2 IMPLANT
SPONGE SURGIFOAM ABS GEL SZ50 (HEMOSTASIS) ×2 IMPLANT
SUT VIC AB 1 CT1 18XBRD ANBCTR (SUTURE) ×1 IMPLANT
SUT VIC AB 1 CT1 8-18 (SUTURE) ×2
SUT VIC AB 2-0 CP2 18 (SUTURE) ×2 IMPLANT
SUT VIC AB 3-0 SH 8-18 (SUTURE) ×2 IMPLANT
SUT VIC AB 4-0 RB1 18 (SUTURE) ×2 IMPLANT
TOWEL GREEN STERILE (TOWEL DISPOSABLE) ×2 IMPLANT
TOWEL GREEN STERILE FF (TOWEL DISPOSABLE) ×2 IMPLANT
WATER STERILE IRR 1000ML POUR (IV SOLUTION) ×2 IMPLANT

## 2021-08-23 NOTE — Anesthesia Procedure Notes (Signed)
Arterial Line Insertion Performed by: Colin Benton, CRNA, CRNA  Patient location: Pre-op. Preanesthetic checklist: patient identified, IV checked, site marked, risks and benefits discussed, surgical consent, monitors and equipment checked, pre-op evaluation, timeout performed and anesthesia consent Lidocaine 1% used for infiltration Right, radial was placed Catheter size: 20 G Hand hygiene performed , maximum sterile barriers used  and Seldinger technique used Allen's test indicative of satisfactory collateral circulation Attempts: 2 Procedure performed without using ultrasound guided technique. Following insertion, dressing applied and Biopatch. Post procedure assessment: normal  Patient tolerated the procedure well with no immediate complications.

## 2021-08-23 NOTE — Anesthesia Postprocedure Evaluation (Signed)
Anesthesia Post Note  Patient: Alec Larson  Procedure(s) Performed: Thoracic ten-eleven , Lumbar three-four Sublaminar decompression (Back)     Patient location during evaluation: PACU Anesthesia Type: General Level of consciousness: awake and alert, patient cooperative and oriented Pain management: pain level controlled Vital Signs Assessment: post-procedure vital signs reviewed and stable Respiratory status: spontaneous breathing, nonlabored ventilation and respiratory function stable Cardiovascular status: blood pressure returned to baseline and stable Postop Assessment: no apparent nausea or vomiting Anesthetic complications: no   No notable events documented.  Last Vitals:  Vitals:   08/23/21 1200 08/23/21 1215  BP: (!) 167/72 (!) 147/83  Pulse: 84 85  Resp: 12 13  Temp:  36.6 C  SpO2: 97% 96%    Last Pain:  Vitals:   08/23/21 1215  TempSrc:   PainSc: 0-No pain                 Auna Mikkelsen,E. Shawnda Mauney

## 2021-08-23 NOTE — H&P (Signed)
CHIEF COMPLAINT: Weakness in the legs, difficulty walking since December 2022.  HISTORY OF PRESENT ILLNESS: Alec Larson is a 76 year old, right-handed individual who tells me that back in December, he had a left knee replacement.  He notes that after the knee replacement, he has had considerable difficulty walking and has not really walked independently since that time.  Progressively, he has gotten worse and worse with instability in his legs, inability to maintain his balance at all, and marked weakness.  Over the last weeks to perhaps a month or more, he has had some bowel and bladder difficulties with loss of control in an unknown fashion.  This past week, he was seen in the emergency department at [facility], and an MRI of the thoracic and lumbar spines was completed.  This study demonstrates that the patient has 2 areas of rather severe stenosis, 1 at T10-T11 where there is a central disc protrusion and a calcific degeneration creating a significant stenosis compressing the spinal cord in this area.  There is no cord signal change that is appreciated on the MRI, but the canal is definitely narrowed.  Beyond that, there is a 2nd area of stenosis at L3-L4, which is moderately severe with both central and lateral recess stenosis.  The patient has other more minor areas of stenosis at L4-5, L2-3, and L1-2, but clearly the 3-4 level is the most severe of these.  Clinically, the patient notes that he cannot stand independently.  PHYSICAL EXAMINATION: Today, he has proximal leg weakness in the iliopsoas that would be graded at 3/5 in either iliopsoas.  He has 4/5 quadriceps strength, 4/5 tibialis anterior strength, and 4/5 gastroc strength.  He has absent patellar reflexes.  He has a 2+ right Achilles reflex, trace left Achilles reflex.  He has preserved vibratory sensation in the distal lower extremities.  IMPRESSION/PLAN: The patient has evidence of 2 areas of rather severe and profound stenosis,  1 at T10-T11 in the thoracic spine with cord compression, the other at L3-L4 with central stenosis of the spinal canal there.  I demonstrated the findings to the patient in the office today, along with his fiancee who accompanies him.  I noted that the patient will need to have these areas decompressed surgically.  This would require laminectomy at T10-T11 and also at L3-L4.  The patient is clearly very physically deconditioned.  He has had some physical therapy for his knee after his surgery; however, that was stopped because he was not making progress and the patient has not really ambulated since.  He does have a number of medical issues including diabetes mellitus, atrial fibrillation, on Eliquis, and I believe that he needs to be cleared medically for a surgery that may take upwards of 2-1/2 hours.  We will see if his primary care physician, Dr. Josiah Lobo, can do this for him.  He is seen at [facility] in [place], [place].  We will see what we can do to expedite that process for him.

## 2021-08-23 NOTE — Progress Notes (Signed)
Patient received from PACU. Alert, Oriented x4. Eating his lunch tray.

## 2021-08-23 NOTE — Transfer of Care (Signed)
Immediate Anesthesia Transfer of Care Note  Patient: Alec Larson  Procedure(s) Performed: Thoracic ten-eleven , Lumbar three-four Sublaminar decompression (Back)  Patient Location: PACU  Anesthesia Type:General  Level of Consciousness: awake and alert   Airway & Oxygen Therapy: Patient Spontanous Breathing and Patient connected to face mask oxygen  Post-op Assessment: Report given to RN and Post -op Vital signs reviewed and stable  Post vital signs: Reviewed and stable  Last Vitals:  Vitals Value Taken Time  BP 158/70 08/23/21 1047  Temp    Pulse 92 08/23/21 1054  Resp 15 08/23/21 1054  SpO2 99 % 08/23/21 1054  Vitals shown include unvalidated device data.  Last Pain:  Vitals:   08/23/21 0657  TempSrc:   PainSc: 0-No pain         Complications: No notable events documented.

## 2021-08-23 NOTE — Op Note (Signed)
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  Surgery through 2 separate incisions. Surgeon: Kristeen Miss Anesthesia: General endotracheal Indications: Alec Larson is a 76 year old individual whose had progressive weakness in his lower extremities.  He has a high-grade stenosis at the level of T10-T11 6 with cord compression at that level.  He also has a secondary of high-grade stenosis at L3-L4 secondary to spondylitic degeneration.  He is markedly weak in his lower extremities and has been advised regarding the need for surgery to decompress both these areas.  Procedure: Patient was brought to the operating rooms supine on the stretcher.  After the smooth induction of general tracheal anesthesia he was carefully turned prone.  The back was prepped with alcohol DuraPrep and draped in a sterile fashion.  Fluoroscopic guidance was used to localize both the L3-4 and the T10-T11 regions.  Then after infiltrating the skin with 1% lidocaine with epinephrine and half percent Marcaine in a 50-50 mixture for total volume of 20 cc vertical incision was created and carried down to the lumbodorsal fascia fascia was opened medial side of midline and a subperiosteal dissection was performed at L3-L4 similar process was then carried out at the T10-T11 level again the second verification was obtained to verify the positioning over L3-4 and T10-T11.  Then on the right side at L3-4 laminotomy was created removing significant thickened redundant yellow ligament.  Through this aperture though I could do a subligamentous decompression on the opposite side and this was carried out to decompress the L3-L4 region fully from left to right.  Once this decompression was completed  attention was turned to T10 and T11 were bilateral laminotomies were created to remove the thickened redundant yellow ligament and decompress the central portion of the spinal canal at the T10-T11 level.  Hemostasis was carefully maintained from the inferior margin of the laminar bone at T10 and once this was verified final radiographs were obtained to verify that procedure was done adequately above and below the disc bases at T10-T11 and L3-L4.  Then the retractors were removed and additional 15 cc of half percent Marcaine was injected into the paraspinous tissue and fascia and the lumbodorsal fascia was closed with #1 Vicryl in interrupted fashion on each incision 2-0 Vicryl was used in the subcutaneous tissues 3-0 Vicryl subcuticularly and 4-0 Vicryl's in the final subcuticular closure at each incision 1 in the thoracic spine 1 in the lumbar spine.  Dermabond was applied to the skin and dry sterile dressing was applied.  Patient was then returned to the recovery room in stable condition blood loss was estimated at 150 cc.

## 2021-08-23 NOTE — Anesthesia Procedure Notes (Signed)
Procedure Name: Intubation Date/Time: 08/23/2021 8:13 AM  Performed by: Valda Favia, CRNAPre-anesthesia Checklist: Patient identified, Emergency Drugs available, Suction available and Patient being monitored Patient Re-evaluated:Patient Re-evaluated prior to induction Oxygen Delivery Method: Circle System Utilized Preoxygenation: Pre-oxygenation with 100% oxygen Induction Type: IV induction Ventilation: Mask ventilation without difficulty and Oral airway inserted - appropriate to patient size Laryngoscope Size: Mac and 4 Grade View: Grade I Tube type: Oral Tube size: 7.5 mm Number of attempts: 1 Airway Equipment and Method: Stylet and Oral airway Placement Confirmation: ETT inserted through vocal cords under direct vision, positive ETCO2 and breath sounds checked- equal and bilateral Secured at: 21 cm Tube secured with: Tape Dental Injury: Teeth and Oropharynx as per pre-operative assessment

## 2021-08-24 ENCOUNTER — Encounter (HOSPITAL_COMMUNITY): Payer: Self-pay | Admitting: Neurological Surgery

## 2021-08-24 DIAGNOSIS — M4714 Other spondylosis with myelopathy, thoracic region: Secondary | ICD-10-CM | POA: Diagnosis not present

## 2021-08-24 LAB — GLUCOSE, CAPILLARY
Glucose-Capillary: 54 mg/dL — ABNORMAL LOW (ref 70–99)
Glucose-Capillary: 55 mg/dL — ABNORMAL LOW (ref 70–99)

## 2021-08-24 MED ORDER — DEXTROSE 5 % IV BOLUS
500.0000 mL | Freq: Once | INTRAVENOUS | Status: AC
  Administered 2021-08-24: 500 mL via INTRAVENOUS

## 2021-08-24 NOTE — Plan of Care (Signed)
  Problem: Activity: Goal: Ability to avoid complications of mobility impairment will improve Outcome: Progressing Goal: Ability to tolerate increased activity will improve Outcome: Progressing Goal: Will remain free from falls Outcome: Progressing   Problem: Pain Management: Goal: Pain level will decrease Outcome: Progressing   Problem: Skin Integrity: Goal: Will show signs of wound healing Outcome: Progressing

## 2021-08-24 NOTE — Evaluation (Signed)
Physical Therapy Evaluation Patient Details Name: Alec Larson MRN: [id number] DOB: Jan 25, 1946 Today's Date: 08/24/2021  History of Present Illness  Alec Larson is a 76 year old, right-handed individual admitted 7/13 for Bilateral laminotomies and decompression of spinal canal T10-T11 right-sided laminotomy L3-L4 with sublaminar decompression of the left side L3-L4.  Pt had considerable difficulty walking and has not really walked independently for months.  Progressively, he has gotten worse and worse with instability in his legs, inability to maintain his balance at all, and marked weakness.  Over the last weeks prior to surgery, he has had some bowel and bladder difficulties with loss of control in an unknown fashion.  MRI done PTA showed T10-T11 where there is a central disc protrusion and a calcific degeneration creating a significant stenosis compressing the spinal cord in this area and  L3-4 with stenosis.  PMH:  HTN, DM  Clinical Impression  Pt admitted with above diagnosis. Pt needed mod assist for safe mobility.  Pt reports that he is alone at times during day and has had multiple falls (more than 20) when he gets up alone at home. Feel that pt will benefit from AIR to incr his safety and independence prior to d/c home. Will follow acutely.  Pt currently with functional limitations due to the deficits listed below (see PT Problem List). Pt will benefit from skilled PT to increase their independence and safety with mobility to allow discharge to the venue listed below.          Recommendations for follow up therapy are one component of a multi-disciplinary discharge planning process, led by the attending physician.  Recommendations may be updated based on patient status, additional functional criteria and insurance authorization.  Follow Up Recommendations Acute inpatient rehab (3hours/day)      Assistance Recommended at Discharge Frequent or constant Supervision/Assistance  Patient can  return home with the following  Two people to help with walking and/or transfers;A lot of help with bathing/dressing/bathroom;Assistance with cooking/housework;Assist for transportation;Help with stairs or ramp for entrance    Equipment Recommendations None recommended by PT  Recommendations for Other Services  Rehab consult    Functional Status Assessment Patient has had a recent decline in their functional status and demonstrates the ability to make significant improvements in function in a reasonable and predictable amount of time.     Precautions / Restrictions Precautions Precautions: Fall;Back Other Brace: No brace needed per MD Restrictions Weight Bearing Restrictions: No      Mobility  Bed Mobility Overal bed mobility: Needs Assistance Bed Mobility: Supine to Sit     Supine to sit: Mod assist     General bed mobility comments: assist needed for LEs to EOB and trunk elevation. Used pad to scoot pts hips out    Transfers Overall transfer level: Needs assistance Equipment used: 2 person hand held assist Transfers: Sit to/from Stand, Bed to chair/wheelchair/BSC Sit to Stand: Mod assist, From elevated surface   Step pivot transfers: Mod assist, From elevated surface Squat pivot transfers: Mod assist, From elevated surface     General transfer comment: Pt needed mod assist to stand using bil UEs for support. Pt took a few pivotal steps to chair and needed bil UE support. Pt maintained flexed hips and knees as well as flexed trunk as he made the pivotal steps to chair.    Ambulation/Gait                  Stairs  Wheelchair Mobility    Modified Rankin (Stroke Patients Only)       Balance Overall balance assessment: Needs assistance Sitting-balance support: No upper extremity supported, Bilateral upper extremity supported, Feet supported Sitting balance-Leahy Scale: Fair     Standing balance support: Bilateral upper extremity  supported, During functional activity Standing balance-Leahy Scale: Poor Standing balance comment: relies on UE support for balance                             Pertinent Vitals/Pain Pain Assessment Pain Assessment: Faces Faces Pain Scale: Hurts even more Pain Location: back Pain Descriptors / Indicators: Aching, Grimacing, Guarding Pain Intervention(s): Limited activity within patient's tolerance, Monitored during session, Repositioned, Patient requesting pain meds-RN notified    Home Living Family/patient expects to be discharged to:: Private residence Living Arrangements: Spouse/significant other Available Help at Discharge: Friend(s);Available PRN/intermittently (girlfriend works, people come and go and help per pt) Type of Home: House Home Access: Ramped entrance       [place]: One level [place]: [place] (2 wheels);Cane - quad;Cane - single point;Toilet riser;Grab bars - tub/shower;Shower seat;BSC/3in1;Adaptive equipment Additional Comments: 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  Pivoted to wheelchair with Modif I.  has had 20 falls when trying to walk since Dec 2020 with RW    Prior Function               Mobility Comments: Sleeps in recliner and does wheelchair transfers ADLs Comments: Bathed with assist with showering; states Modif I with dressing with reacher     Hand Dominance   Dominant Hand: Right    Extremity/Trunk Assessment   Upper Extremity Assessment Upper Extremity Assessment: Defer to OT evaluation    Lower Extremity Assessment Lower Extremity Assessment: Generalized weakness    Cervical / Trunk Assessment Cervical / Trunk Assessment: Normal  Communication   Communication: No difficulties  Cognition Arousal/Alertness: Awake/alert Behavior During Therapy: WFL for tasks assessed/performed Overall Cognitive Status: Within Functional Limits for tasks assessed                                           General Comments      Exercises     Assessment/Plan    PT Assessment Patient needs continued PT services  PT Problem List Decreased activity tolerance;Decreased balance;Decreased mobility;Decreased knowledge of use of DME;Decreased safety awareness;Decreased knowledge of precautions;Pain       PT Treatment Interventions DME instruction;Gait training;Functional mobility training;Therapeutic activities;Therapeutic exercise;Balance training;Patient/family education    PT Goals (Current goals can be found in the Care Plan section)  Acute Rehab PT Goals Patient Stated Goal: to go home PT Goal Formulation: With patient Time For Goal Achievement: 09/07/21 Potential to Achieve Goals: Good    Frequency Min 5X/week     Co-evaluation               AM-PAC PT "6 Clicks" Mobility  Outcome Measure Help needed turning from your back to your side while in a flat bed without using bedrails?: A Lot Help needed moving from lying on your back to sitting on the side of a flat bed without using bedrails?: A Lot Help needed moving to and from a bed to a chair (including a wheelchair)?: A Lot Help needed standing up from a chair using your arms (e.g.,  wheelchair or bedside chair)?: A Lot Help needed to walk in hospital room?: Total Help needed climbing 3-5 steps with a railing? : Total 6 Click Score: 10    End of Session Equipment Utilized During Treatment: Gait belt Activity Tolerance: Patient limited by fatigue Patient left: in chair;with call bell/phone within reach;with chair alarm set Nurse Communication: Mobility status;Need for lift equipment PT Visit Diagnosis: Muscle weakness (generalized) (M62.81);Unsteadiness on feet (R26.81);Pain Pain - part of body:  (back)    Time: 0254-2706 PT Time Calculation (min) (ACUTE ONLY): 23 min   Charges:   PT Evaluation $PT Eval Moderate Complexity: 1 Mod PT Treatments $Therapeutic Activity: 8-22  mins        [facility] M,PT Acute Rehab Services [phone]   Alvira Philips 08/24/2021, 2:20 PM

## 2021-08-24 NOTE — Progress Notes (Signed)
Pt was noted with low BS of 54, snacks and orange juice was given, Pt was asymptomatic, drowsy and arousable to name calling. BS was rechecked one hour later with a slight improvement of 55. On call provided was contacted for more intervention. IVF bolus was ordered. See mar for details.

## 2021-08-24 NOTE — Progress Notes (Signed)
Inpatient Rehab Admissions Coordinator:  Consult received. Note pt is under observation status at this time. Pt may not have the medical necessity to warrant an inpatient rehab stay if they remain observation. If status were to change to inpatient, [facility] will assess for candidacy.   Gayland Curry, [place], [place] Admissions Coordinator [phone]

## 2021-08-24 NOTE — Evaluation (Signed)
Occupational Therapy Evaluation Patient Details Name: Alec Larson MRN: [id number] DOB: 1945-04-17 Today's Date: 08/24/2021   History of Present Illness 76 yo, right-handed individual admitted 7/13 for Bilateral laminotomies and decompression of spinal canal T10-T11 right-sided laminotomy L3-L4 with sublaminar decompression of the left side L3-L4.  Pt had considerable difficulty walking and has not really walked independently for months.  PMH:  HTN, DM   Clinical Impression   Patient is s/p L3-4 laminotomy surgery resulting in functional limitations due to the deficits listed below (see OT problem list). Pt is high fall risk reporting multiple falls prior to admission. Pt declining SNF placement and reports previous admission with poor experience. Pt agreeable to CIR however pt is observation status which is a barrier for consideration.  Patient will benefit from skilled OT acutely to increase independence and safety with ADLS to allow discharge CIR. Pt is agreeable to [facility] if d/c home is required       Recommendations for follow up therapy are one component of a multi-disciplinary discharge planning process, led by the attending physician.  Recommendations may be updated based on patient status, additional functional criteria and insurance authorization.   Follow Up Recommendations  Acute inpatient rehab (3hours/day)    Assistance Recommended at Discharge Intermittent Supervision/Assistance  Patient can return home with the following Two people to help with walking and/or transfers;Two people to help with bathing/dressing/bathroom;Assist for transportation    Functional Status Assessment  Patient has had a recent decline in their functional status and demonstrates the ability to make significant improvements in function in a reasonable and predictable amount of time.  Equipment Recommendations  None recommended by OT    Recommendations for Other Services Rehab consult     Precautions /  Restrictions Precautions Precautions: Fall;Back Other Brace: No brace needed per MD Restrictions Weight Bearing Restrictions: No      Mobility Bed Mobility               General bed mobility comments: oob in chair on arrival    Transfers Overall transfer level: Needs assistance Equipment used: 2 person hand held assist Transfers: Sit to/from Stand, Bed to chair/wheelchair/BSC Sit to Stand: Mod assist           General transfer comment: used a stedy for sit<>stand. pt abl to static stand for 2 minutes      Balance Overall balance assessment: Needs assistance Sitting-balance support: No upper extremity supported, Bilateral upper extremity supported, Feet supported Sitting balance-Leahy Scale: Fair     Standing balance support: Bilateral upper extremity supported, During functional activity Standing balance-Leahy Scale: Poor Standing balance comment: relies on UE support for balance                           ADL either performed or assessed with clinical judgement   ADL Overall ADL's : Needs assistance/impaired Eating/Feeding: Independent       Upper Body Bathing: Minimal assistance   Lower Body Bathing: Moderate assistance   Upper Body Dressing : Minimal assistance   Lower Body Dressing: Moderate assistance   Toilet Transfer: Moderate assistance                   Vision Baseline Vision/History: 1 Wears glasses       Perception     Praxis      Pertinent Vitals/Pain Pain Assessment Pain Assessment: Faces Faces Pain Scale: Hurts little more Pain Location: back Pain Descriptors / Indicators: Aching, Grimacing,  Guarding Pain Intervention(s): Monitored during session, Premedicated before session, Repositioned     Hand Dominance Right   Extremity/Trunk Assessment Upper Extremity Assessment Upper Extremity Assessment: RUE deficits/detail;LUE deficits/detail RUE Deficits / Details: reports fall on shoulder with rotator cuff  tear LUE Deficits / Details: reports fall on shoulder multiple times and concerns for rotator issues   Lower Extremity Assessment Lower Extremity Assessment: Defer to PT evaluation   Cervical / Trunk Assessment Cervical / Trunk Assessment: Normal   Communication Communication Communication: No difficulties   Cognition Arousal/Alertness: Awake/alert Behavior During Therapy: WFL for tasks assessed/performed Overall Cognitive Status: Within Functional Limits for tasks assessed                                       General Comments  dressing x2 dry and intact at this time    Exercises Exercises: General Lower Extremity General Exercises - Lower Extremity Quad Sets: AROM, 10 reps, Both, Seated   Shoulder Instructions      Home Living Family/patient expects to be discharged to:: Private residence Living Arrangements: Spouse/significant other Available Help at Discharge: Friend(s);Available PRN/intermittently (girlfriend works, people come and go and help per pt) Type of Home: House Home Access: Ramped entrance     [place]: One level     Bathroom Shower/Tub: Occupational psychologist: Handicapped height     [place]: [place] (2 wheels);Cane - quad;Cane - single point;Toilet riser;Grab bars - tub/shower;Shower seat;BSC/3in1;Adaptive equipment Adaptive Equipment: Reacher Additional Comments: 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  Pivoted to wheelchair with Modif I.  has had 20 falls when trying to walk since Dec 2020 with RW      Prior Functioning/Environment Prior Level of Function : Needs assist             Mobility Comments: Sleeps in recliner and does wheelchair transfers ADLs Comments: Bathed with assist with showering; states Modif I with dressing with reacher        OT Problem List: Decreased activity tolerance;Impaired balance (sitting and/or standing);Decreased safety  awareness;Obesity      OT Treatment/Interventions: Self-care/ADL training;Therapeutic exercise;DME and/or AE instruction;Therapeutic activities;Patient/family education;Balance training    OT Goals(Current goals can be found in the care plan section) Acute Rehab OT Goals Patient Stated Goal: to get back home. i am not doing that therapy center again OT Goal Formulation: With patient Time For Goal Achievement: 09/07/21 Potential to Achieve Goals: Good  OT Frequency: Min 2X/week    Co-evaluation              AM-PAC OT "6 Clicks" Daily Activity     Outcome Measure Help from another person eating meals?: None Help from another person taking care of personal grooming?: None Help from another person toileting, which includes using toliet, bedpan, or urinal?: A Lot Help from another person bathing (including washing, rinsing, drying)?: A Lot Help from another person to put on and taking off regular upper body clothing?: A Little Help from another person to put on and taking off regular lower body clothing?: A Lot 6 Click Score: 17   End of Session Equipment Utilized During Treatment: Gait belt;Other (comment) (stedy) Nurse Communication: Mobility status;Precautions  Activity Tolerance: Patient tolerated treatment well Patient left: in chair;with call bell/phone within reach;with chair alarm set  OT Visit Diagnosis: Unsteadiness on feet (R26.81);Muscle weakness (generalized) (M62.81)  Time: [id number] OT Time Calculation (min): 25 min Charges:  OT General Charges $OT Visit: 1 Visit OT Evaluation $OT Eval Moderate Complexity: 1 Mod OT Treatments $Self Care/Home Management : 8-22 mins   B[place]nn, OTR/L  Acute Rehabilitation Services Office: [phone] .   Jeri Modena 08/24/2021, 3:29 PM

## 2021-08-24 NOTE — Progress Notes (Signed)
Patient ID: Alec Larson, male   DOB: 1945-07-15, 76 y.o.   MRN: [id number] Vital signs are stable Patient is feeling better with less numbness in the legs Motor strength testing reveals 4 out of 5 strength in the iliopsoas and quads Physical therapy should try to ambulate him today I believe he would be a good patient for comprehensive inpatient rehabilitation Otherwise he may need a SNF We will ask rehab to evaluate.

## 2021-08-25 DIAGNOSIS — M4714 Other spondylosis with myelopathy, thoracic region: Secondary | ICD-10-CM | POA: Diagnosis not present

## 2021-08-25 LAB — GLUCOSE, CAPILLARY
Glucose-Capillary: 129 mg/dL — ABNORMAL HIGH (ref 70–99)
Glucose-Capillary: 135 mg/dL — ABNORMAL HIGH (ref 70–99)
Glucose-Capillary: 142 mg/dL — ABNORMAL HIGH (ref 70–99)
Glucose-Capillary: 181 mg/dL — ABNORMAL HIGH (ref 70–99)

## 2021-08-25 MED ORDER — METHOCARBAMOL 500 MG PO TABS
500.0000 mg | ORAL_TABLET | Freq: Four times a day (QID) | ORAL | 3 refills | Status: AC | PRN
Start: 2021-08-25 — End: ?

## 2021-08-25 MED ORDER — OXYCODONE-ACETAMINOPHEN 10-325 MG PO TABS
1.0000 | ORAL_TABLET | Freq: Four times a day (QID) | ORAL | 0 refills | Status: AC | PRN
Start: 2021-08-25 — End: ?

## 2021-08-25 NOTE — Discharge Summary (Signed)
Physician Discharge Summary  Patient ID: Alec Larson MRN: [id number] DOB/AGE: 1945-09-27 76 y.o.  Admit date: 08/23/2021 Discharge date: 08/25/2021  Admission Diagnoses: Thoracic myelopathy T10-T11, lumbar stenosis L3-L4.  Neurogenic claudication.  Weakness of lower extremities.  Discharge Diagnoses: Thoracic myelopathy T10-T11.  Lumbar stenosis L3-L4.  Neurogenic claudication.  Weakness of the lower extremities.  Diabetes mellitus. Principal Problem:   Myelopathy concurrent with and due to spinal stenosis of thoracic region [facility])   Discharged Condition: fair  Hospital Course: Patient was admitted to undergo surgical decompression at T11 T10 and L3-L4.  He tolerated surgery well.  He has had some improvement in lower extremity functions.  He will be discharged home for home health physical therapy.  Consults:  None  Significant Diagnostic Studies: None  Treatments: surgery: See op note  Discharge Exam: Blood pressure (!) 117/56, pulse 86, temperature 98.9 F (37.2 C), temperature source Oral, resp. rate 17, height '5\' 4"'$  (1.626 m), weight 102 kg, SpO2 95 %. Is awake and alert.  Motor function is 4 out of 5 in iliopsoas quadricep tibialis anterior and gastrocs.  Patient can transfer with assistance.  He is able to balance onto his feet with some assistance also.  He will receive home health physical therapy and Occupational Therapy.  Disposition: Discharge disposition: 01-Home or Self Care       Discharge Instructions     Call MD for:  redness, tenderness, or signs of infection (pain, swelling, redness, odor or green/yellow discharge around incision site)   Complete by: As directed    Call MD for:  severe uncontrolled pain   Complete by: As directed    Call MD for:  temperature >100.4   Complete by: As directed    Diet - low sodium heart healthy   Complete by: As directed    Discharge wound care:   Complete by: As directed    Okay to shower.  Remove honeycomb dressing  after first shower.  Do not apply salves or appointments to incision. No heavy lifting with the upper extremities greater than 10 pounds. May resume driving when not requiring pain medication and patient feels comfortable with doing so.   Incentive spirometry RT   Complete by: As directed    Increase activity slowly   Complete by: As directed       Allergies as of 08/25/2021       Reactions   Aspirin Anaphylaxis, Hives   Bee Venom Hives, Other (See Comments)   All bees   Penicillins Hives   Did it involve swelling of the face/tongue/throat, SOB, or low BP? No Did it involve sudden or severe rash/hives, skin peeling, or any reaction on the inside of your mouth or nose? No Did you need to seek medical attention at a hospital or doctor's office?Never had a reaction When did it last happen? Never had a reaction to this medication. If all above answers are "NO", may proceed with cephalosporin use.        Medication List     TAKE these medications    atorvastatin 40 MG tablet Commonly known as: LIPITOR Take 40 mg by mouth in the morning.   benazepril 40 MG tablet Commonly known as: LOTENSIN Take 40 mg by mouth in the morning.   CALCIUM + D3 PO Take 1 tablet by mouth in the morning.   CHOLECALCIFEROL PO Take 1 capsule by mouth daily.   [employer] COQ-10 PO Take 1 capsule by mouth in the morning.   diltiazem 300 MG  24 hr capsule Commonly known as: CARDIZEM CD Take 300 mg by mouth in the morning.   DULoxetine 60 MG capsule Commonly known as: CYMBALTA Take 60 mg by mouth daily.   Eliquis 5 MG Tabs tablet Generic drug: apixaban Take 5 mg by mouth 2 (two) times daily.   EpiPen 2-Pak 0.3 mg/0.3 mL Soaj injection Generic drug: EPINEPHrine Inject 0.3 mg into the muscle as needed for anaphylaxis.   FISH OIL PO Take 1 capsule by mouth in the morning and at bedtime.   fluticasone 50 MCG/ACT nasal spray Commonly known as: FLONASE Place 2 sprays into both nostrils in the  morning.   folic acid 469 MCG tablet Commonly known as: FOLVITE Take 800 mcg by mouth in the morning.   gabapentin 300 MG capsule Commonly known as: NEURONTIN Take 300-900 mg by mouth See admin instructions. Take 2 capsules (600 mg) in the morning & 3 capsules (900 mg) by mouth at night   HumuLIN 70/30 KwikPen (70-30) 100 UNIT/ML KwikPen Generic drug: insulin isophane & regular human KwikPen Inject 50-55 Units into the skin in the morning and at bedtime. Inject 50 units into the skin in the morning and 55 units into the skin at dinner.   isosorbide dinitrate 10 MG tablet Commonly known as: ISORDIL Take 10 mg by mouth 3 (three) times daily.   methocarbamol 500 MG tablet Commonly known as: ROBAXIN Take 1 tablet (500 mg total) by mouth every 6 (six) hours as needed for muscle spasms.   Multivitamin Adults 50+ Tabs Take 1 tablet by mouth daily.   nitrofurantoin 50 MG capsule Commonly known as: MACRODANTIN Take 1 capsule (50 mg total) by mouth at bedtime.   omeprazole 20 MG capsule Commonly known as: PRILOSEC Take 20 mg by mouth daily before breakfast.   oxyCODONE-acetaminophen 10-325 MG tablet Commonly known as: PERCOCET Take 1 tablet by mouth every 6 (six) hours as needed for pain.   silodosin 8 MG Caps capsule Commonly known as: RAPAFLO Take 1 capsule (8 mg total) by mouth at bedtime.   spironolactone 50 MG tablet Commonly known as: ALDACTONE Take 50 mg by mouth daily.   traMADol 50 MG tablet Commonly known as: ULTRAM Take 1 tablet (50 mg total) by mouth every 12 (twelve) hours as needed.   Trulicity 1.5 GE/9.5MW Sopn Generic drug: Dulaglutide Inject 1.5 mg into the skin every Wednesday.   vitamin B-12 1000 MCG tablet Commonly known as: CYANOCOBALAMIN Take 1,000 mcg by mouth in the morning.   zinc gluconate 50 MG tablet Take 50 mg by mouth daily.               Discharge Care Instructions  (From admission, onward)           Start     Ordered    08/25/21 0000  Discharge wound care:       Comments: Okay to shower.  Remove honeycomb dressing after first shower.  Do not apply salves or appointments to incision. No heavy lifting with the upper extremities greater than 10 pounds. May resume driving when not requiring pain medication and patient feels comfortable with doing so.   08/25/21 0836             Signed: Blanchie Dessert Jeb Schloemer 08/25/2021, 8:38 AM

## 2021-08-25 NOTE — Progress Notes (Signed)
Patient ID: Alec Larson, male   DOB: 02-21-1945, 76 y.o.   MRN: [id number] Vital signs are stable Blood sugars have been under good control Motor function appears to be improving slowly Patient wishes to go home Incisions are clean and dry.

## 2021-08-25 NOTE — TOC Transition Note (Signed)
Transition of Care [facility]) - CM/SW Discharge Note   Patient Details  Name: Amias Hutchinson MRN: [id number] Date of Birth: 1945-07-10  [facility] CM/SW Contact:  Bartholomew Crews, RN Phone Number: [phone] 08/25/2021, 10:42 AM   Clinical Narrative:     Patient to transition home today. Spoke with patient on hospital room phone to discuss post acute transition. PTA home with girlfriend. Has all needed DME. Discussed recommendations for [facility] PT/OT - patient agreeable. Offered choice - no preference. Referral pending. Patient has arranged for transportation home.   Final next level of care: [place] Barriers to Discharge: No Barriers Identified   Patient Goals and CMS Choice Patient states their goals for this hospitalization and ongoing recovery are:: home with girlfriend CMS Medicare.gov Compare Post Acute Care list provided to:: Patient Choice offered to / list presented to : Patient  Discharge Placement                       Discharge Plan and Services                DME Arranged: N/A DME Agency: NA       HH Arranged: PT, OT       Representative spoke with at [place]: Referral pending  Social Determinants of Health (SDOH) Interventions     Readmission Risk Interventions     No data to display

## 2021-08-25 NOTE — Care Management Obs Status (Signed)
[place] NOTIFICATION   Patient Details  Name: Alec Larson MRN: [id number] Date of Birth: 09-05-1945   Medicare Observation Status Notification Given:  Yes    Bartholomew Crews, RN 08/25/2021, 12:22 PM

## 2021-08-25 NOTE — Plan of Care (Signed)
  Problem: Activity: Goal: Ability to avoid complications of mobility impairment will improve Outcome: Progressing Goal: Ability to tolerate increased activity will improve Outcome: Progressing   Problem: Pain Management: Goal: Pain level will decrease Outcome: Progressing   Problem: Skin Integrity: Goal: Will show signs of wound healing Outcome: Progressing

## 2021-08-25 NOTE — TOC Transition Note (Addendum)
Transition of Care [facility]) - CM/SW Discharge Note   Patient Details  Name: Alec Larson MRN: [id number] Date of Birth: [birth year]  Transition [facility] Care [facility]) CM/SW Contact:  Bartholomew Crews, RN Phone Number: [phone] 08/25/2021, 11:46 AM   Clinical Narrative:     Home Health referral accepted by [facility]. Contact information placed on AVS. No more TOC needs identified.   Patient notified by phone of accepting home health agency.   Final next level of care: [place] Barriers to Discharge: No Barriers Identified   Patient Goals and CMS Choice Patient states their goals for this hospitalization and ongoing recovery are:: home with girlfriend CMS Medicare.gov Compare Post Acute Care list provided to:: Patient Choice offered to / list presented to : Patient  Discharge Placement                       Discharge Plan and Services                DME Arranged: N/A DME Agency: NA       HH Arranged: PT, OT HH Agency: [place] Date [place]: 08/25/21 Time [place]: 1115 Representative spoke with at [place]: [place] (SDOH) Interventions     Readmission Risk Interventions     No data to display

## 2022-03-07 ENCOUNTER — Other Ambulatory Visit: Payer: Self-pay | Admitting: Urology

## 2022-03-07 DIAGNOSIS — N401 Enlarged prostate with lower urinary tract symptoms: Secondary | ICD-10-CM

## 2022-03-14 ENCOUNTER — Other Ambulatory Visit: Payer: Self-pay | Admitting: Urology

## 2022-03-14 DIAGNOSIS — N390 Urinary tract infection, site not specified: Secondary | ICD-10-CM

## 2022-05-01 ENCOUNTER — Other Ambulatory Visit: Payer: Self-pay

## 2022-05-01 ENCOUNTER — Emergency Department (HOSPITAL_COMMUNITY)
Admission: EM | Admit: 2022-05-01 | Discharge: 2022-05-01 | Disposition: A | Discharge status: Home / Self Care | Payer: 59 | Attending: Emergency Medicine | Admitting: Emergency Medicine

## 2022-05-01 ENCOUNTER — Encounter (HOSPITAL_COMMUNITY): Payer: Self-pay | Admitting: *Deleted

## 2022-05-01 ENCOUNTER — Emergency Department (HOSPITAL_COMMUNITY): Payer: 59

## 2022-05-01 DIAGNOSIS — Z7901 Long term (current) use of anticoagulants: Secondary | ICD-10-CM | POA: Diagnosis not present

## 2022-05-01 DIAGNOSIS — M25561 Pain in right knee: Secondary | ICD-10-CM | POA: Diagnosis present

## 2022-05-01 NOTE — ED Triage Notes (Signed)
Pt with right knee pain for past 4-5 days, worse today.  Denies any injury to knee.

## 2022-05-01 NOTE — Discharge Instructions (Addendum)
Your x-ray tonight shows degenerative changes of your knee.  I recommend that you use over-the-counter diclofenac/Voltaren gel directly to your knee joint.  You may use your brace if needed for support.  Please contact the orthopedic provider listed to arrange a follow-up appointment.

## 2022-05-03 NOTE — ED Provider Notes (Signed)
[place] Provider Note   CSN: [id number] Arrival date & time: 05/01/22  [id number]     History  Chief Complaint  Patient presents with   Knee Pain    Alec Larson is a 77 y.o. male.   Knee Pain Associated symptoms: no fever        Alec Larson is a 77 y.o. male who presents to the Emergency Department complaining of gradually worsening right knee pain for 4 to 5 days.  Pain worse today which prompted his ER visit.  Denies any recent injury.  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  He denies any fever, chills, swelling of his knee, pain numbness or weakness of the extremity.  Pain worse with movement and weightbearing.  Applies ice intermittently for temporary relief   Home Medications Prior to Admission medications   Medication Sig Start Date End Date Taking? Authorizing Provider  atorvastatin (LIPITOR) 40 MG tablet Take 40 mg by mouth in the morning. 12/28/18   [provider]  benazepril (LOTENSIN) 40 MG tablet Take 40 mg by mouth in the morning. 12/28/18   [provider]  Calcium Carb-Cholecalciferol (CALCIUM + D3 PO) Take 1 tablet by mouth in the morning.    [provider]  CHOLECALCIFEROL PO Take 1 capsule by mouth daily.    [provider]  Coenzyme Q10 (CVS COQ-10 PO) Take 1 capsule by mouth in the morning.    [provider]  diltiazem (CARDIZEM CD) 300 MG 24 hr capsule Take 300 mg by mouth in the morning. 03/11/21   [provider]  DULoxetine (CYMBALTA) 60 MG capsule Take 60 mg by mouth daily. 12/28/18   [provider]  ELIQUIS 5 MG TABS tablet Take 5 mg by mouth 2 (two) times daily. 04/21/21   [provider]  EPINEPHrine (EPIPEN 2-PAK) 0.3 mg/0.3 mL IJ SOAJ injection Inject 0.3 mg into the muscle as needed for anaphylaxis.    [provider]  fluticasone (FLONASE) 50 MCG/ACT nasal spray Place 2 sprays into both nostrils in the morning.    [provider]  folic acid (FOLVITE) [id number] MCG tablet Take 800 mcg by mouth in the morning.    [provider]  gabapentin (NEURONTIN) 300 MG capsule Take 300-900 mg by mouth See admin instructions. Take 2 capsules (600 mg) in the morning & 3 capsules (900 mg) by mouth at night 12/28/18   [provider]  HUMULIN 70/30 KWIKPEN (70-30) 100 UNIT/ML KwikPen Inject 50-55 Units into the skin in the morning and at bedtime. Inject 50 units into the skin in the morning and 55 units into the skin at dinner. 07/18/21   [provider]  isosorbide dinitrate (ISORDIL) 10 MG tablet Take 10 mg by mouth 3 (three) times daily. 04/21/21   [provider]  methocarbamol (ROBAXIN) 500 MG tablet Take 1 tablet (500 mg total) by mouth every 6 (six) hours as needed for muscle spasms. 08/25/21   Kristeen Miss, MD  Multiple Vitamins-Minerals (MULTIVITAMIN ADULTS 50+) TABS Take 1 tablet by mouth daily.    [provider]  nitrofurantoin (MACRODANTIN) 50 MG capsule TAKE 1 CAPSULE BY MOUTH AT BEDTIME 03/15/22   McKenzie, Candee Furbish, MD  Omega-3 Fatty Acids (FISH OIL PO) Take 1 capsule by mouth in the morning and at bedtime.  [provider]  omeprazole (PRILOSEC) 20 MG capsule Take 20 mg by mouth daily before breakfast.  12/28/18   [provider]  oxyCODONE-acetaminophen (PERCOCET) 10-325 MG tablet Take 1 tablet by mouth every 6 (six) hours as needed for pain. 08/25/21   Kristeen Miss, MD  silodosin (RAPAFLO) 8 MG CAPS capsule TAKE ONE CAPSULE BY MOUTH AT BEDTIME 03/11/22   McKenzie, Candee Furbish, MD  spironolactone (ALDACTONE) 50 MG tablet Take 50 mg by mouth daily. 06/12/21   [provider]  traMADol (ULTRAM) 50 MG tablet Take 1 tablet (50 mg total) by mouth every 12 (twelve) hours as needed. 07/21/21   Horton, Alvin Critchley, DO  TRULICITY 1.5  0000000 SOPN Inject 1.5 mg into the skin every Wednesday. 05/17/21   [provider]  vitamin B-12 (CYANOCOBALAMIN) 1000 MCG tablet Take 1,000 mcg by mouth in the morning.    [provider]  zinc gluconate 50 MG tablet Take 50 mg by mouth daily.    [provider]      Allergies    Aspirin, Bee venom, and Penicillins    Review of Systems   Review of Systems  Constitutional:  Negative for appetite change and fever.  Respiratory:  Negative for shortness of breath.   Cardiovascular:  Negative for chest pain.  Gastrointestinal:  Negative for nausea and vomiting.  Musculoskeletal:  Positive for arthralgias (Right knee pain). Negative for joint swelling.  Skin:  Negative for color change and rash.  Neurological:  Negative for weakness and numbness.    Physical Exam Updated Vital Signs BP 114/81 (BP Location: Left Arm)   Pulse 73   Temp 97.7 F (36.5 C) (Oral)   Resp 16   Ht 5\' 3"  (1.6 m)   Wt 97.5 kg   SpO2 97%   BMI 38.09 kg/m  Physical Exam Vitals and nursing note reviewed.  Constitutional:      General: He is not in acute distress.    Appearance: Normal appearance. He is not toxic-appearing.  Cardiovascular:     Rate and Rhythm: Normal rate and regular rhythm.     Pulses: Normal pulses.  Pulmonary:     Effort: Pulmonary effort is normal.  Musculoskeletal:        General: Tenderness present. No swelling or deformity.     Right knee: No swelling, effusion, erythema or crepitus. Tenderness present over the medial joint line. No patellar tendon tenderness. Normal alignment and normal patellar mobility.     Instability Tests: Anterior drawer test negative. Posterior drawer test negative.       Legs:     Comments: Tenderness to palpation along the medial aspect of the right knee.  No palpable effusion, erythema, or excessive warmth.  Calf is soft and nontender.  Pain on valgus and varus stress without obvious instability.  Skin:    General: Skin is  warm.     Capillary Refill: Capillary refill takes less than 2 seconds.     Findings: No bruising or erythema.  Neurological:     General: No focal deficit present.     Mental Status: He is alert.     Sensory: No sensory deficit.     Motor: No weakness.     ED Results / Procedures / Treatments   Labs (all labs ordered are listed, but only abnormal results are displayed) Labs Reviewed - No data to display  EKG None  Radiology DG Knee Complete 4 Views Right  Result Date: 05/01/2022 CLINICAL DATA:  Pain  EXAM: RIGHT KNEE - COMPLETE 4+ VIEW COMPARISON:  Radiograph 07/21/2021 FINDINGS: No fracture or malalignment. Mild patellofemoral degenerative change. No sizable knee effusion IMPRESSION: Mild patellofemoral degenerative change. Electronically Signed   By: Donavan Foil M.D.   On: 05/01/2022 19:51    Procedures Procedures    Medications Ordered in ED Medications - No data to display  ED Course/ Medical Decision Making/ A&P                            Medical Decision Making Patient here for evaluation of worsening of chronic right knee pain.  No recent injury.  Pain worsens with movement and improves at rest.  Differential includes musculoskeletal injury, worsening of degenerative changes of the knee, septic joint, ligamentous instability.  Exam of the right knee without evidence of acute injury or process.  Suspect degenerative changes of the knee.  Will obtain x-ray for further evaluation  Amount and/or Complexity of Data Reviewed Radiology: ordered. Decision-making details documented in ED Course.    Details: X-ray of the knee shows patellofemoral degenerative changes.  No sizable knee effusion.  No acute bony finding. Discussion of management or test interpretation with external provider(s): Discussed x-ray finding with patient.  States he has pain medication at home.  Offered brace but patient declined.  Will give follow-up information for local  orthopedics.           Final Clinical Impression(s) / ED Diagnoses Final diagnoses:  Acute pain of right knee    Rx / DC Orders ED Discharge Orders     None         Kem Parkinson, PA-C 05/03/22 0933    Godfrey Pick, MD 05/07/22 1737

## 2022-05-14 ENCOUNTER — Ambulatory Visit (INDEPENDENT_AMBULATORY_CARE_PROVIDER_SITE_OTHER): Payer: 59 | Admitting: Orthopedic Surgery

## 2022-05-14 ENCOUNTER — Encounter: Payer: Self-pay | Admitting: Orthopedic Surgery

## 2022-05-14 DIAGNOSIS — M25561 Pain in right knee: Secondary | ICD-10-CM | POA: Diagnosis not present

## 2022-05-14 NOTE — Patient Instructions (Signed)

## 2022-05-15 NOTE — Progress Notes (Signed)
New Patient Visit  Assessment: Alec Larson is a 77 y.o. 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  He has had intermittent pain for several years.  Over the last couple months, it has progressively worsened.  I reviewed radiographs in clinic today which demonstrates some mild to moderate degenerative changes overall.  We discussed proceeding with an injection, and he elected to proceed.  This was completed without issues.  He will follow-up in clinic as needed.   Procedure note injection Right knee joint   Verbal consent was obtained to inject the right knee joint  Timeout was completed to confirm the site of injection.  The skin was prepped with alcohol and ethyl chloride was sprayed at the injection site.  A 21-gauge needle was used to inject 40 mg of Depo-Medrol and 1% lidocaine (3 cc) into the right knee using an anterolateral approach.  There were no complications. A sterile bandage was applied.   Follow-up: Return if symptoms worsen or fail to improve.  Subjective:  Chief Complaint  Patient presents with   Knee Pain    R knee pain for yrs worse over the past few mos.   NDC: [id number]    History of Present Illness: Alec Larson is a 77 y.o. male who presents for evaluation of right knee pain.  He states he had pain in the right knee intermittently for several years.  No specific injury.  He has fallen several times.  He has been seen in the emergency department recently, and presents today for evaluation.  Pain is diffuse.  Typically requires assistance with ambulation.  No specific risk for falls.  No physical therapy.  He has not had an injection in his right knee.  He has a history of left total knee arthroplasty, which was completed in late 2022.   Review of Systems: No fevers or chills No numbness or tingling No chest pain No shortness of breath No bowel or bladder dysfunction No GI  distress No headaches   Medical History:  Past Medical History:  Diagnosis Date   Anxiety    Aortic stenosis    moderate AS (08/10/21 echo)   BPH (benign prostatic hyperplasia)    Coronary artery disease    diffuse aortic and coronary atherosclerosis 08/08/19 CT; intermediate stress test suggestive old inferior infarct with mild peri-infarct ischemia, EF 72% 06/02/20, medical therapy (08/20/21, Dr. Candis Musa)   Diabetes mellitus without complication    Dysrhythmia    GERD (gastroesophageal reflux disease)    H/O renal calculi    Heart murmur    Hypercholesteremia    Hypertension    PAF (paroxysmal atrial fibrillation)    afib with RVR 05/02/20 in setting of sepsus of unclear etiology    Past Surgical History:  Procedure Laterality Date   BACK SURGERY     BIOPSY  03/19/2019   Procedure: BIOPSY;  Surgeon: Rogene Houston, MD;  [facility];  Service: Endoscopy;;   CERVICAL SPINE SURGERY     COLONOSCOPY N/A 03/19/2019   Procedure: COLONOSCOPY;  Surgeon: Rogene Houston, MD;  [facility];  Service: Endoscopy;  Laterality: N/A;  [place] N/A 01/20/2019   Procedure: CYSTOSCOPY WITH INSERTION OF UROLIFT;  Surgeon: Cleon Gustin, MD;  Location: [facility];  Service: Urology;  Laterality: N/A;  30 MINS   ESOPHAGOGASTRODUODENOSCOPY N/A 03/19/2019   Procedure: ESOPHAGOGASTRODUODENOSCOPY (EGD);  Surgeon: Rogene Houston, MD;  [facility];  Service: Endoscopy;  Laterality: N/A;  730   JOINT REPLACEMENT Left 01/2021   LUMBAR LAMINECTOMY/DECOMPRESSION MICRODISCECTOMY N/A 08/23/2021   Procedure: Thoracic ten-eleven , Lumbar three-four Sublaminar decompression;  Surgeon: Kristeen Miss, MD;  Location: [place];  Service: Neurosurgery;  Laterality: N/A;   POLYPECTOMY  03/19/2019   Procedure: POLYPECTOMY;  Surgeon: Rogene Houston, MD;  [facility];  Service: Endoscopy;;   TONSILLECTOMY     age 38    History reviewed.  No pertinent family history. Social History   Tobacco Use   Smoking status: Former   Smokeless tobacco: Never  Substance Use Topics   Alcohol use: Not Currently   Drug use: Never    Allergies  Allergen Reactions   Aspirin Anaphylaxis and Hives   Bee Venom Hives and Other (See Comments)    All bees    Penicillins Hives    Did it involve swelling of the face/tongue/throat, SOB, or low BP? No Did it involve sudden or severe rash/hives, skin peeling, or any reaction on the inside of your mouth or nose? No Did you need to seek medical attention at a hospital or doctor's office?Never had a reaction When did it last happen? Never had a reaction to this medication. If all above answers are "NO", may proceed with cephalosporin use.     Current Meds  Medication Sig   atorvastatin (LIPITOR) 40 MG tablet Take 40 mg by mouth in the morning.   benazepril (LOTENSIN) 40 MG tablet Take 40 mg by mouth in the morning.   Calcium Carb-Cholecalciferol (CALCIUM + D3 PO) Take 1 tablet by mouth in the morning.   CHOLECALCIFEROL PO Take 1 capsule by mouth daily.   Coenzyme Q10 ([employer] COQ-10 PO) Take 1 capsule by mouth in the morning.   diltiazem (CARDIZEM CD) 300 MG 24 hr capsule Take 300 mg by mouth in the morning.   DULoxetine (CYMBALTA) 60 MG capsule Take 60 mg by mouth daily.   ELIQUIS 5 MG TABS tablet Take 5 mg by mouth 2 (two) times daily.   EPINEPHrine (EPIPEN 2-PAK) 0.3 mg/0.3 mL IJ SOAJ injection Inject 0.3 mg into the muscle as needed for anaphylaxis.   fluticasone (FLONASE) 50 MCG/ACT nasal spray Place 2 sprays into both nostrils in the morning.   folic acid (FOLVITE) [id number] MCG tablet Take 800 mcg by mouth in the morning.   gabapentin (NEURONTIN) 300 MG capsule Take 300-900 mg by mouth See admin instructions. Take 2 capsules (600 mg) in the morning & 3 capsules (900 mg) by mouth at night   HUMULIN 70/30 KWIKPEN (70-30) 100 UNIT/ML KwikPen Inject 50-55 Units into the skin in the morning and at  bedtime. Inject 50 units into the skin in the morning and 55 units into the skin at dinner.   isosorbide dinitrate (ISORDIL) 10 MG tablet Take 10 mg by mouth 3 (three) times daily.   methocarbamol (ROBAXIN) 500 MG tablet Take 1 tablet (500 mg total) by mouth every 6 (six) hours as needed for muscle spasms.   Multiple Vitamins-Minerals (MULTIVITAMIN ADULTS 50+) TABS Take 1 tablet by mouth daily.   nitrofurantoin (MACRODANTIN) 50 MG capsule TAKE 1 CAPSULE BY MOUTH AT BEDTIME   Omega-3 Fatty Acids (FISH OIL PO) Take 1 capsule by mouth in the morning and at bedtime.   omeprazole (PRILOSEC) 20 MG capsule Take 20 mg by mouth daily before breakfast.    oxyCODONE-acetaminophen (PERCOCET) 10-325 MG tablet Take  1 tablet by mouth every 6 (six) hours as needed for pain.   silodosin (RAPAFLO) 8 MG CAPS capsule TAKE ONE CAPSULE BY MOUTH AT BEDTIME   spironolactone (ALDACTONE) 50 MG tablet Take 50 mg by mouth daily.   traMADol (ULTRAM) 50 MG tablet Take 1 tablet (50 mg total) by mouth every 12 (twelve) hours as needed.   TRULICITY 1.5 0000000 SOPN Inject 1.5 mg into the skin every Wednesday.   vitamin B-12 (CYANOCOBALAMIN) 1000 MCG tablet Take 1,000 mcg by mouth in the morning.   zinc gluconate 50 MG tablet Take 50 mg by mouth daily.    Objective: There were no vitals taken for this visit.  Physical Exam:  General: Elderly male., Alert and oriented., No acute distress., and Seated in a wheelchair. Gait: Unable to ambulate.  Right knee with a mild effusion.  Tender to palpation over the medial joint line.  Good ROM.  Negative Lachman.  No laxity to varus or valgus stress.  Sensation intact distally. Multiple superficial abrasions, healthy appearing.   IMAGING: I personally reviewed images previously obtained from the ED  XR Of the right knee from the ED with maintenance of joint space.  Small osteophytes.  No acute injury.     New Medications:  No orders of the defined types were placed in this  encounter.     Mordecai Rasmussen, MD  05/15/2022 12:57 PM

## 2022-05-31 ENCOUNTER — Telehealth: Payer: Self-pay | Admitting: Orthopedic Surgery

## 2022-05-31 NOTE — Telephone Encounter (Signed)
Return if symptoms worsen or fail to improve.  Per Dr Trilby Drummer will call to schedule him a follow up visit.  I called told him to ice the knee in the meantime He can't use any NSAIDS he is on blood thinners He is also on Tramadol which is not helping much.

## 2022-05-31 NOTE — Telephone Encounter (Signed)
Jan Soyers called at 12:02 pm left voicemail stating patient is hurting really bad.  He got the injection and it worked for 2 or 3 days, then started hurting and now it is really bad.   Please call back at [phone]

## 2022-07-23 ENCOUNTER — Encounter (HOSPITAL_COMMUNITY): Payer: Self-pay

## 2022-07-23 ENCOUNTER — Inpatient Hospital Stay: Admit: 2022-07-23 | Payer: 59 | Admitting: Internal Medicine

## 2022-07-26 ENCOUNTER — Encounter (HOSPITAL_COMMUNITY): Payer: Self-pay | Admitting: Neurological Surgery

## 2022-07-26 DIAGNOSIS — M4804 Spinal stenosis, thoracic region: Secondary | ICD-10-CM

## 2022-08-05 ENCOUNTER — Other Ambulatory Visit (HOSPITAL_COMMUNITY): Payer: Self-pay | Admitting: Neurological Surgery

## 2022-08-05 DIAGNOSIS — M4804 Spinal stenosis, thoracic region: Secondary | ICD-10-CM

## 2022-08-05 DIAGNOSIS — M48061 Spinal stenosis, lumbar region without neurogenic claudication: Secondary | ICD-10-CM

## 2022-08-30 ENCOUNTER — Ambulatory Visit (HOSPITAL_COMMUNITY)
Admission: RE | Admit: 2022-08-30 | Discharge: 2022-08-30 | Disposition: A | Discharge status: Home / Self Care | Payer: 59 | Source: Ambulatory Visit | Attending: Neurological Surgery | Admitting: Neurological Surgery

## 2022-08-30 DIAGNOSIS — M4804 Spinal stenosis, thoracic region: Secondary | ICD-10-CM | POA: Insufficient documentation

## 2022-08-30 DIAGNOSIS — M48061 Spinal stenosis, lumbar region without neurogenic claudication: Secondary | ICD-10-CM | POA: Insufficient documentation

## 2022-08-30 MED ORDER — GADOBUTROL 1 MMOL/ML IV SOLN
10.0000 mL | Freq: Once | INTRAVENOUS | Status: AC | PRN
  Administered 2022-08-30: 10 mL via INTRAVENOUS

## 2022-08-30 MED ORDER — GADOBUTROL 1 MMOL/ML IV SOLN: 10.0000 mL | Freq: Once | INTRAVENOUS | Status: DC | PRN

## 2022-11-25 ENCOUNTER — Other Ambulatory Visit: Payer: Self-pay | Admitting: *Deleted

## 2022-11-25 DIAGNOSIS — M79606 Pain in leg, unspecified: Secondary | ICD-10-CM

## 2022-12-04 ENCOUNTER — Ambulatory Visit (HOSPITAL_COMMUNITY)
Admission: RE | Admit: 2022-12-04 | Discharge: 2022-12-04 | Disposition: A | Discharge status: Home / Self Care | Payer: 59 | Source: Ambulatory Visit | Attending: Vascular Surgery | Admitting: Vascular Surgery

## 2022-12-04 ENCOUNTER — Ambulatory Visit (INDEPENDENT_AMBULATORY_CARE_PROVIDER_SITE_OTHER): Payer: 59 | Admitting: Vascular Surgery

## 2022-12-04 ENCOUNTER — Encounter: Payer: Self-pay | Admitting: Vascular Surgery

## 2022-12-04 VITALS — BP 124/58 | HR 63 | Temp 98.1°F | Ht 63.0 in | Wt 210.0 lb

## 2022-12-04 DIAGNOSIS — M25561 Pain in right knee: Secondary | ICD-10-CM | POA: Diagnosis not present

## 2022-12-04 DIAGNOSIS — M25562 Pain in left knee: Secondary | ICD-10-CM | POA: Diagnosis not present

## 2022-12-04 DIAGNOSIS — M79606 Pain in leg, unspecified: Secondary | ICD-10-CM | POA: Insufficient documentation

## 2022-12-04 NOTE — Progress Notes (Signed)
Patient ID: Alec Larson, male   DOB: Mar 29, 1945, 77 y.o.   MRN: [id number]  Reason for Consult: New Patient (Initial Visit)   Referred by Heywood Footman,*  Subjective:     HPI:  Alec Larson is a 77 y.o. male history of aortic stenosis and coronary artery disease and previously paroxysmal atrial fibrillation.  He also has a history of spinal osteoarthritis and has had previous spinal surgery including lumbar discectomy and decompression.  He states that he is unable to walk due to the issues with his back and has pain in the back radiating down both of the upper legs.  He was evaluated by home health [facility] and found to have decreased blood flow in his legs which he presents for evaluation today.  He does not have any frank claudication although he cannot walk far enough to really elicit symptoms but does not have rest pain or tissue loss.  Past Medical History:  Diagnosis Date   Anxiety    Aortic stenosis    moderate AS (08/10/21 echo)   BPH (benign prostatic hyperplasia)    Coronary artery disease    diffuse aortic and coronary atherosclerosis 08/08/19 CT; intermediate stress test suggestive old inferior infarct with mild peri-infarct ischemia, EF 72% 06/02/20, medical therapy (08/20/21, Dr. Sharrell Ku)   Diabetes mellitus without complication (HCC)    Dysrhythmia    GERD (gastroesophageal reflux disease)    H/O renal calculi    Heart murmur    Hypercholesteremia    Hypertension    PAF (paroxysmal atrial fibrillation) (HCC)    afib with RVR 05/02/20 in setting of sepsus of unclear etiology   History reviewed. No pertinent family history. Past Surgical History:  Procedure Laterality Date   BACK SURGERY     BIOPSY  03/19/2019   Procedure: BIOPSY;  Surgeon: Malissa Hippo, MD;  [facility];  Service: Endoscopy;;   CERVICAL SPINE SURGERY     COLONOSCOPY N/A 03/19/2019   Procedure: COLONOSCOPY;  Surgeon: Malissa Hippo, MD;  [facility];   Service: Endoscopy;  Laterality: N/A;  730   CYSTOSCOPY WITH INSERTION OF UROLIFT N/A 01/20/2019   Procedure: CYSTOSCOPY WITH INSERTION OF UROLIFT;  Surgeon: Malen Gauze, MD;  [facility];  Service: Urology;  Laterality: N/A;  30 MINS   ESOPHAGOGASTRODUODENOSCOPY N/A 03/19/2019   Procedure: ESOPHAGOGASTRODUODENOSCOPY (EGD);  Surgeon: Malissa Hippo, MD;  [facility];  Service: Endoscopy;  Laterality: N/A;  730   JOINT REPLACEMENT Left 01/2021   LUMBAR LAMINECTOMY/DECOMPRESSION MICRODISCECTOMY N/A 08/23/2021   Procedure: Thoracic ten-eleven , Lumbar three-four Sublaminar decompression;  Surgeon: Barnett Abu, MD;  Location: [facility];  Service: Neurosurgery;  Laterality: N/A;   POLYPECTOMY  03/19/2019   Procedure: POLYPECTOMY;  Surgeon: Malissa Hippo, MD;  [facility];  Service: Endoscopy;;   TONSILLECTOMY     age 64    Short Social History:  Social History   Tobacco Use   Smoking status: Former   Smokeless tobacco: Never  Substance Use Topics   Alcohol use: Not Currently    Allergies  Allergen Reactions   Aspirin Anaphylaxis and Hives   Bee Venom Hives and Other (See Comments)    All bees    Penicillins Hives    Did it involve swelling of the face/tongue/throat, SOB, or low BP? No Did it involve sudden or severe rash/hives, skin peeling, or any reaction on the inside of your mouth or nose? No Did you  need to seek medical attention at a hospital or doctor's office?Never had a reaction When did it last happen? Never had a reaction to this medication. If all above answers are "NO", may proceed with cephalosporin use.     Current Outpatient Medications  Medication Sig Dispense Refill   atorvastatin (LIPITOR) 40 MG tablet Take 40 mg by mouth in the morning.     benazepril (LOTENSIN) 40 MG tablet Take 40 mg by mouth in the morning.     Calcium Carb-Cholecalciferol (CALCIUM + D3 PO) Take 1 tablet by mouth in the morning.     CHOLECALCIFEROL  PO Take 1 capsule by mouth daily.     Coenzyme Q10 ([employer]S COQ-10 PO) Take 1 capsule by mouth in the morning.     diltiazem (CARDIZEM CD) 300 MG 24 hr capsule Take 300 mg by mouth in the morning.     DULoxetine (CYMBALTA) 60 MG capsule Take 60 mg by mouth daily.     ELIQUIS 5 MG TABS tablet Take 5 mg by mouth 2 (two) times daily.     EPINEPHrine (EPIPEN 2-PAK) 0.3 mg/0.3 mL IJ SOAJ injection Inject 0.3 mg into the muscle as needed for anaphylaxis.     fluticasone (FLONASE) 50 MCG/ACT nasal spray Place 2 sprays into both nostrils in the morning.     gabapentin (NEURONTIN) 300 MG capsule Take 300-900 mg by mouth See admin instructions. Take 2 capsules (600 mg) in the morning & 3 capsules (900 mg) by mouth at night     HUMULIN 70/30 KWIKPEN (70-30) 100 UNIT/ML KwikPen Inject 50-55 Units into the skin in the morning and at bedtime. Inject 50 units into the skin in the morning and 55 units into the skin at dinner.     isosorbide dinitrate (ISORDIL) 10 MG tablet Take 10 mg by mouth 3 (three) times daily.     methocarbamol (ROBAXIN) 500 MG tablet Take 1 tablet (500 mg total) by mouth every 6 (six) hours as needed for muscle spasms. 30 tablet 3   Multiple Vitamins-Minerals (MULTIVITAMIN ADULTS 50+) TABS Take 1 tablet by mouth daily.     nitrofurantoin (MACRODANTIN) 50 MG capsule TAKE 1 CAPSULE BY MOUTH AT BEDTIME 90 capsule 3   Omega-3 Fatty Acids (FISH OIL PO) Take 1 capsule by mouth in the morning and at bedtime.     omeprazole (PRILOSEC) 20 MG capsule Take 20 mg by mouth daily before breakfast.      oxyCODONE-acetaminophen (PERCOCET) 10-325 MG tablet Take 1 tablet by mouth every 6 (six) hours as needed for pain. 30 tablet 0   silodosin (RAPAFLO) 8 MG CAPS capsule TAKE ONE CAPSULE BY MOUTH AT BEDTIME 90 capsule 3   spironolactone (ALDACTONE) 50 MG tablet Take 50 mg by mouth daily.     traMADol (ULTRAM) 50 MG tablet Take 1 tablet (50 mg total) by mouth every 12 (twelve) hours as needed. 10 tablet 0    TRULICITY 1.5 MG/0.5ML SOPN Inject 1.5 mg into the skin every Wednesday.     vitamin B-12 (CYANOCOBALAMIN) 1000 MCG tablet Take 1,000 mcg by mouth in the morning.     zinc gluconate 50 MG tablet Take 50 mg by mouth daily.     folic acid (FOLVITE) 800 MCG tablet Take 800 mcg by mouth in the morning. (Patient not taking: Reported on 12/04/2022)     No current facility-administered medications for this visit.    Review of Systems  Constitutional: Positive for fatigue.  HENT: HENT negative.  Eyes: Eyes negative.  Respiratory: Respiratory negative.  Cardiovascular: Cardiovascular negative.  GI: Gastrointestinal negative.  Musculoskeletal: Positive for back pain and leg pain.  Skin: Skin negative.  Neurological: Positive for focal weakness.  Hematologic: Positive for bruises/bleeds easily.  Psychiatric: Psychiatric negative.        Objective:  Objective   Vitals:   12/04/22 1503  BP: (!) 124/58  Pulse: 63  Temp: 98.1 F (36.7 C)  TempSrc: Temporal  SpO2: 94%  Weight: 210 lb (95.3 kg)  Height: 5\' 3"  (1.6 m)   Body mass index is 37.2 kg/m.  Physical Exam HENT:     Head: Normocephalic.     Nose: Nose normal.     Mouth/Throat:     Mouth: Mucous membranes are moist.  Eyes:     Pupils: Pupils are equal, round, and reactive to light.  Cardiovascular:     Pulses: Normal pulses.  Abdominal:     General: Abdomen is flat.     Palpations: Abdomen is soft.  Musculoskeletal:        General: Normal range of motion.     Right lower leg: No edema.     Left lower leg: No edema.  Skin:    General: Skin is warm.     Capillary Refill: Capillary refill takes less than 2 seconds.  Neurological:     General: No focal deficit present.     Mental Status: He is alert.  Psychiatric:        Mood and Affect: Mood normal.        Thought Content: Thought content normal.        Judgment: Judgment normal.     Data: ABI Findings:  +---------+------------------+-----+---------+--------+   Right   Rt Pressure (mmHg)IndexWaveform Comment   +---------+------------------+-----+---------+--------+  Brachial 137                                       +---------+------------------+-----+---------+--------+  ATA     151               1.10 biphasic           +---------+------------------+-----+---------+--------+  PTA     161               1.18 triphasic          +---------+------------------+-----+---------+--------+  Great Toe126               0.92                    +---------+------------------+-----+---------+--------+   +---------+------------------+-----+---------+-------+  Left    Lt Pressure (mmHg)IndexWaveform Comment  +---------+------------------+-----+---------+-------+  Brachial 110                                      +---------+------------------+-----+---------+-------+  ATA     149               1.09 triphasic         +---------+------------------+-----+---------+-------+  PTA     159               1.16 triphasic         +---------+------------------+-----+---------+-------+  Great Toe114               0.83                   +---------+------------------+-----+---------+-------+   +-------+-----------+-----------+------------+------------+  ABI/TBIToday's ABIToday's TBIPrevious ABIPrevious TBI  +-------+-----------+-----------+------------+------------+  Right 1.18       0.92                                 +-------+-----------+-----------+------------+------------+  Left  1.16       0.83                                 +-------+-----------+-----------+------------+------------+         Summary:  Right: Resting right ankle-brachial index is within normal range. The  right toe-brachial index is normal.   Left: Resting left ankle-brachial index is within normal range. The left  toe-brachial index is normal.       Assessment/Plan:    77 year old male sent for evaluation of  lower back pain with radiation down both legs.  ABIs are normal as such does not need continued follow-up.  We discussed the need for activity as tolerated and that his pain is not related to arterial insufficiency.  Patient demonstrates good understanding and can see [place] on an as-needed basis.     Maeola Harman MD [facility]

## 2022-12-05 LAB — VAS US ABI WITH/WO TBI
Left ABI: 1.16
Right ABI: 1.18

## 2023-02-16 IMAGING — DX DG LUMBAR SPINE 2-3V
3 series · 3 of 3 positions shown · non-contrast
Comparison: 08/08/2019

CLINICAL DATA: Low back pain, frequent falls

EXAM:
LUMBAR SPINE - 2-3 VIEW

[l-spine ap]
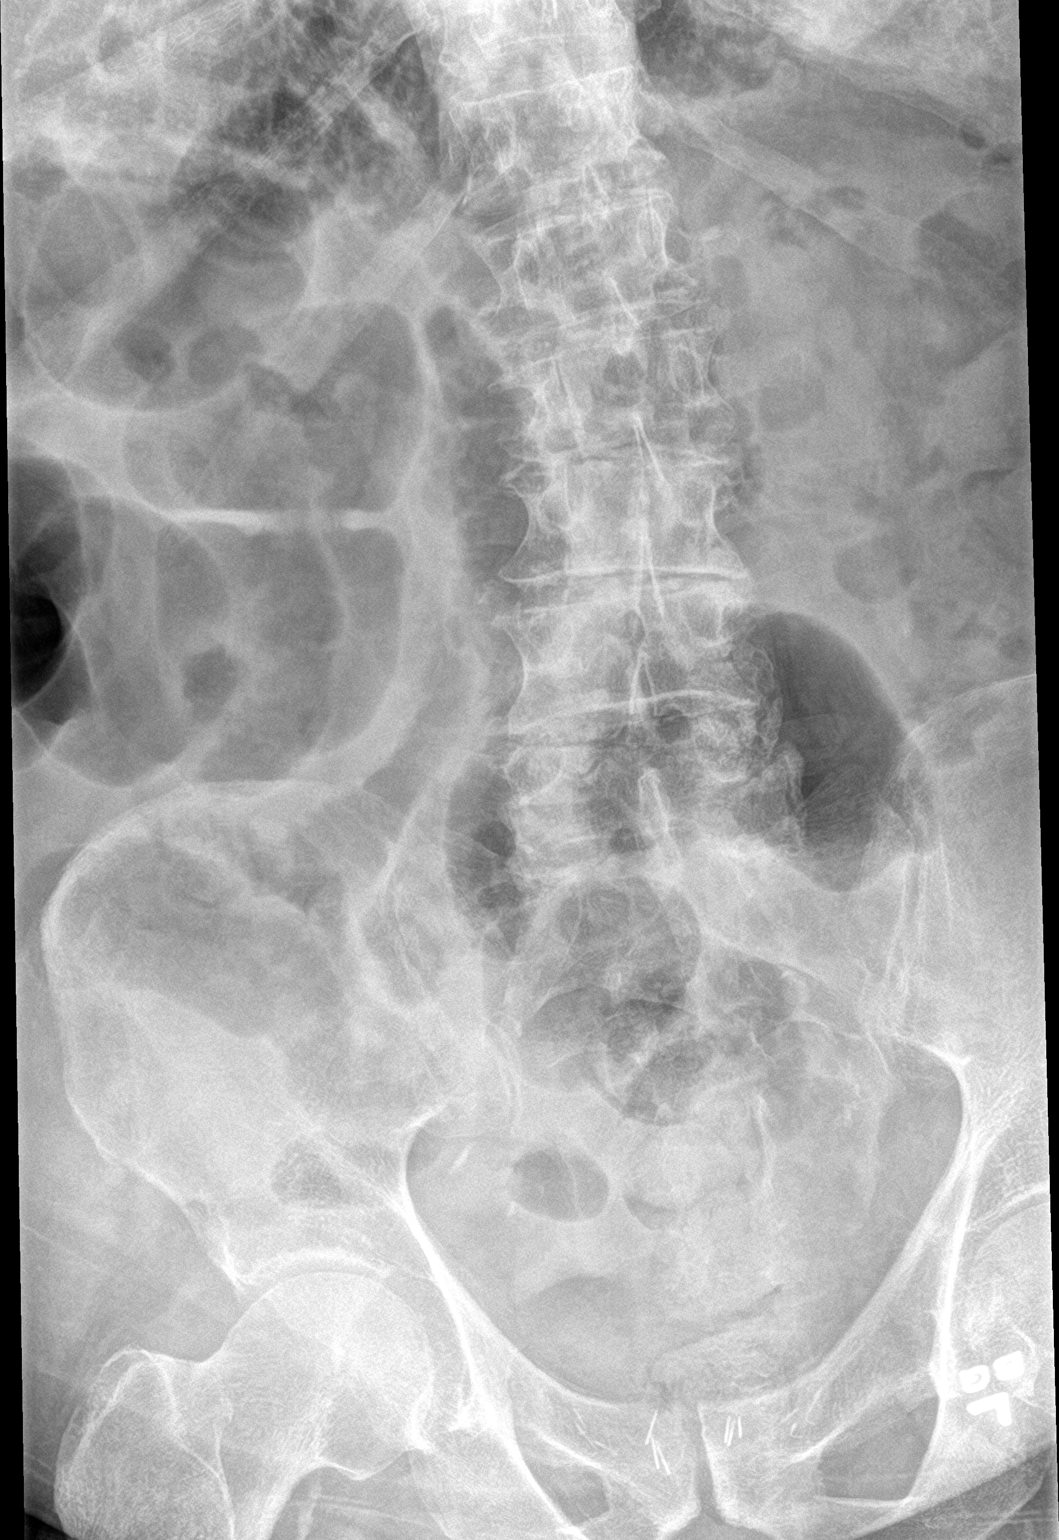

[l-spine lat]
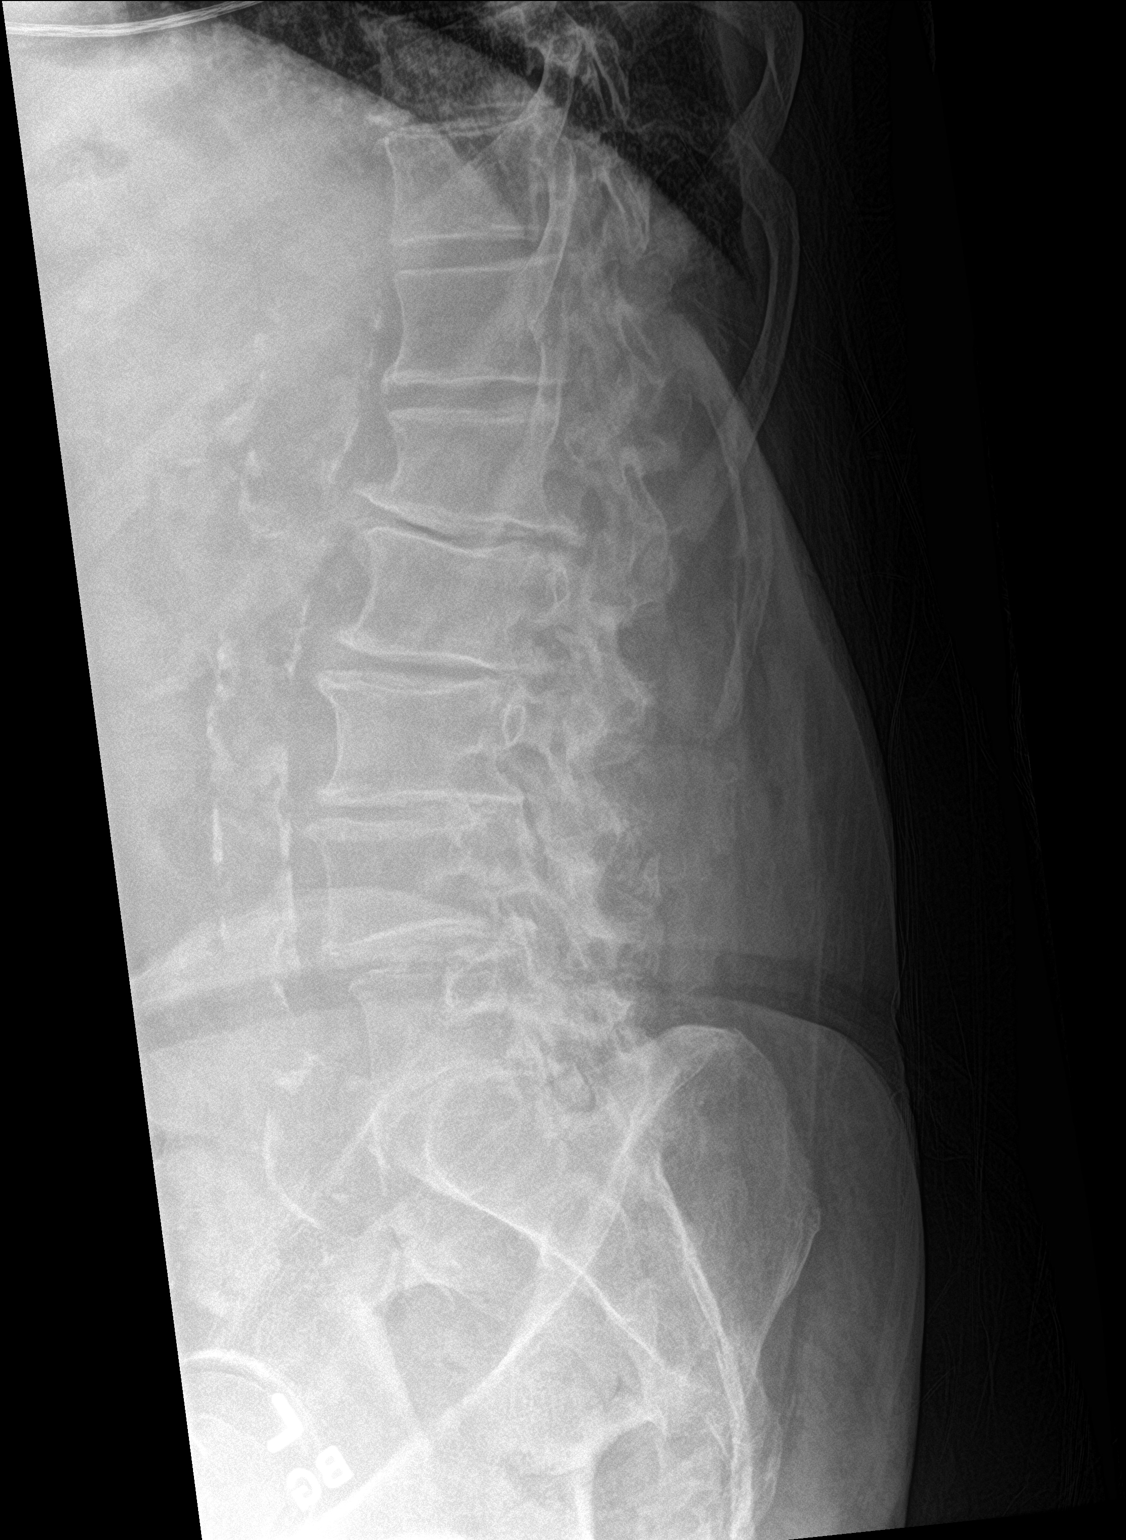

[l-spine spot]
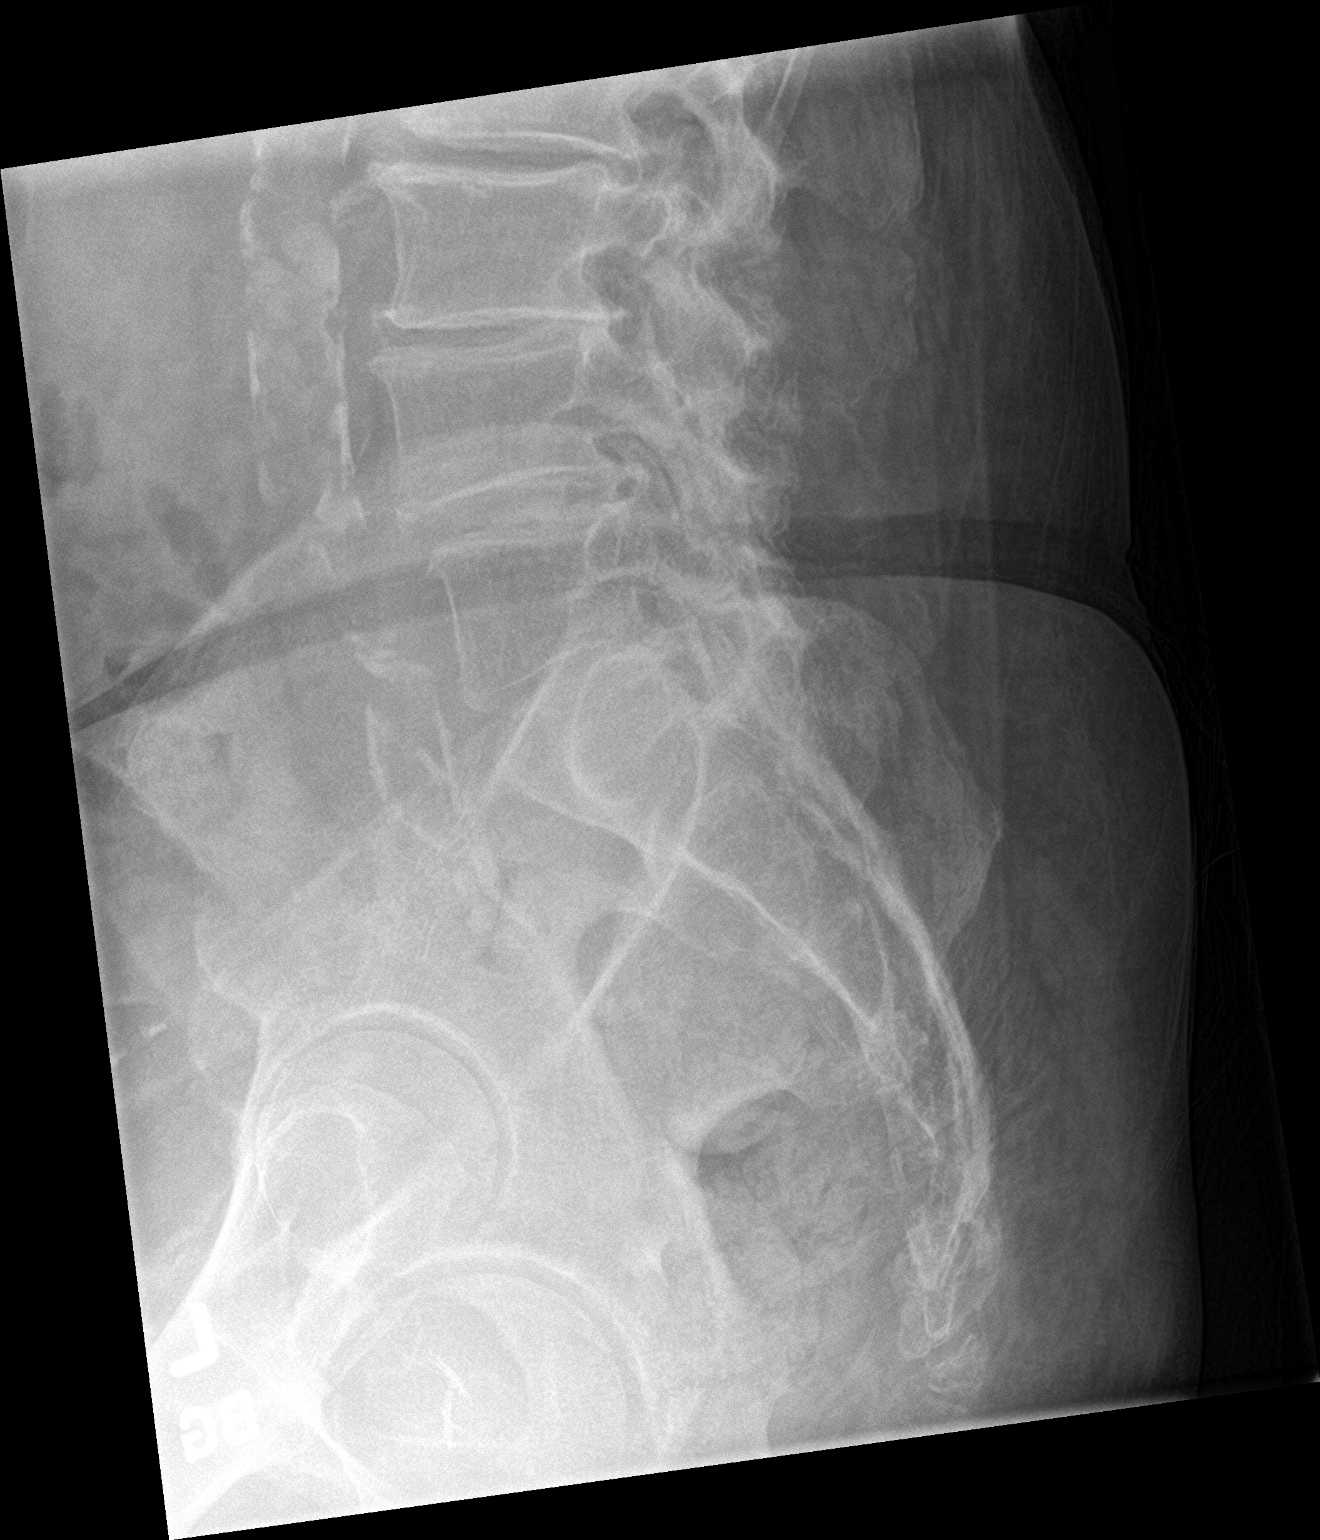

[3 of 3 positions shown; findings below may reference images not displayed]

FINDINGS: Five lumbar type vertebral segments. Vertebral body heights and
alignment are maintained. No fracture identified. Multilevel
intervertebral disc space loss with associated degenerative endplate
changes. Lower lumbar facet arthrosis. Advanced abdominal aortic
atherosclerosis.
IMPRESSION: Moderate multilevel lumbar spondylosis.  No acute findings.

## 2023-02-27 ENCOUNTER — Other Ambulatory Visit: Payer: Self-pay | Admitting: Urology

## 2023-02-27 DIAGNOSIS — N138 Other obstructive and reflux uropathy: Secondary | ICD-10-CM

## 2023-02-27 DIAGNOSIS — N390 Urinary tract infection, site not specified: Secondary | ICD-10-CM

## 2023-09-12 DEATH — deceased
